# Patient Record
Sex: Male | Born: 1945 | Race: White | Hispanic: No | Smoking: Former smoker
Health system: Southern US, Community
[De-identification: ages and names within clinical notes are randomized; demographics above are authoritative.]

## PROBLEM LIST (undated history)

## (undated) DIAGNOSIS — J449 Chronic obstructive pulmonary disease, unspecified: Secondary | ICD-10-CM

## (undated) DIAGNOSIS — I509 Heart failure, unspecified: Secondary | ICD-10-CM

## (undated) DIAGNOSIS — N189 Chronic kidney disease, unspecified: Secondary | ICD-10-CM

## (undated) DIAGNOSIS — I10 Essential (primary) hypertension: Secondary | ICD-10-CM

## (undated) HISTORY — PX: CHOLECYSTECTOMY: SHX55

---

## 2019-12-13 ENCOUNTER — Emergency Department (HOSPITAL_COMMUNITY): Payer: Medicare Other

## 2019-12-13 ENCOUNTER — Other Ambulatory Visit: Payer: Self-pay

## 2019-12-13 ENCOUNTER — Observation Stay (HOSPITAL_COMMUNITY)
Admission: EM | Admit: 2019-12-13 | Discharge: 2019-12-14 | Disposition: A | Discharge status: Home / Self Care | Payer: Medicare Other | Attending: General Surgery | Admitting: General Surgery

## 2019-12-13 ENCOUNTER — Encounter (HOSPITAL_COMMUNITY): Payer: Self-pay | Admitting: Emergency Medicine

## 2019-12-13 DIAGNOSIS — I4891 Unspecified atrial fibrillation: Secondary | ICD-10-CM | POA: Insufficient documentation

## 2019-12-13 DIAGNOSIS — Z87891 Personal history of nicotine dependence: Secondary | ICD-10-CM | POA: Diagnosis not present

## 2019-12-13 DIAGNOSIS — Z8673 Personal history of transient ischemic attack (TIA), and cerebral infarction without residual deficits: Secondary | ICD-10-CM | POA: Diagnosis not present

## 2019-12-13 DIAGNOSIS — S20219A Contusion of unspecified front wall of thorax, initial encounter: Secondary | ICD-10-CM | POA: Diagnosis not present

## 2019-12-13 DIAGNOSIS — Y92410 Unspecified street and highway as the place of occurrence of the external cause: Secondary | ICD-10-CM | POA: Diagnosis not present

## 2019-12-13 DIAGNOSIS — I2510 Atherosclerotic heart disease of native coronary artery without angina pectoris: Secondary | ICD-10-CM | POA: Insufficient documentation

## 2019-12-13 DIAGNOSIS — Y9389 Activity, other specified: Secondary | ICD-10-CM | POA: Insufficient documentation

## 2019-12-13 DIAGNOSIS — J984 Other disorders of lung: Secondary | ICD-10-CM | POA: Insufficient documentation

## 2019-12-13 DIAGNOSIS — N189 Chronic kidney disease, unspecified: Secondary | ICD-10-CM | POA: Insufficient documentation

## 2019-12-13 DIAGNOSIS — J90 Pleural effusion, not elsewhere classified: Secondary | ICD-10-CM | POA: Insufficient documentation

## 2019-12-13 DIAGNOSIS — Z7982 Long term (current) use of aspirin: Secondary | ICD-10-CM | POA: Insufficient documentation

## 2019-12-13 DIAGNOSIS — Z79899 Other long term (current) drug therapy: Secondary | ICD-10-CM | POA: Insufficient documentation

## 2019-12-13 DIAGNOSIS — I272 Pulmonary hypertension, unspecified: Secondary | ICD-10-CM | POA: Insufficient documentation

## 2019-12-13 DIAGNOSIS — I509 Heart failure, unspecified: Secondary | ICD-10-CM | POA: Insufficient documentation

## 2019-12-13 DIAGNOSIS — Z20822 Contact with and (suspected) exposure to covid-19: Secondary | ICD-10-CM | POA: Insufficient documentation

## 2019-12-13 DIAGNOSIS — Z888 Allergy status to other drugs, medicaments and biological substances status: Secondary | ICD-10-CM | POA: Diagnosis not present

## 2019-12-13 DIAGNOSIS — S27892A Contusion of other specified intrathoracic organs, initial encounter: Secondary | ICD-10-CM | POA: Diagnosis present

## 2019-12-13 DIAGNOSIS — I130 Hypertensive heart and chronic kidney disease with heart failure and stage 1 through stage 4 chronic kidney disease, or unspecified chronic kidney disease: Secondary | ICD-10-CM | POA: Insufficient documentation

## 2019-12-13 DIAGNOSIS — I700 Atherosclerosis of aorta: Secondary | ICD-10-CM | POA: Diagnosis not present

## 2019-12-13 DIAGNOSIS — N62 Hypertrophy of breast: Secondary | ICD-10-CM | POA: Diagnosis not present

## 2019-12-13 DIAGNOSIS — J439 Emphysema, unspecified: Secondary | ICD-10-CM | POA: Diagnosis not present

## 2019-12-13 DIAGNOSIS — M5430 Sciatica, unspecified side: Secondary | ICD-10-CM | POA: Diagnosis not present

## 2019-12-13 DIAGNOSIS — N281 Cyst of kidney, acquired: Secondary | ICD-10-CM | POA: Diagnosis not present

## 2019-12-13 DIAGNOSIS — Z9049 Acquired absence of other specified parts of digestive tract: Secondary | ICD-10-CM | POA: Diagnosis not present

## 2019-12-13 DIAGNOSIS — S2221XA Fracture of manubrium, initial encounter for closed fracture: Secondary | ICD-10-CM | POA: Diagnosis not present

## 2019-12-13 DIAGNOSIS — I428 Other cardiomyopathies: Secondary | ICD-10-CM | POA: Insufficient documentation

## 2019-12-13 HISTORY — DX: Heart failure, unspecified: I50.9

## 2019-12-13 HISTORY — DX: Essential (primary) hypertension: I10

## 2019-12-13 HISTORY — DX: Chronic kidney disease, unspecified: N18.9

## 2019-12-13 HISTORY — DX: Chronic obstructive pulmonary disease, unspecified: J44.9

## 2019-12-13 LAB — COMPREHENSIVE METABOLIC PANEL
ALT: 13 U/L (ref 0–44)
AST: 19 U/L (ref 15–41)
Albumin: 3.2 g/dL — ABNORMAL LOW (ref 3.5–5.0)
Alkaline Phosphatase: 60 U/L (ref 38–126)
Anion gap: 11 (ref 5–15)
BUN: 24 mg/dL — ABNORMAL HIGH (ref 8–23)
CO2: 24 mmol/L (ref 22–32)
Calcium: 8.3 mg/dL — ABNORMAL LOW (ref 8.9–10.3)
Chloride: 108 mmol/L (ref 98–111)
Creatinine, Ser: 1.61 mg/dL — ABNORMAL HIGH (ref 0.61–1.24)
GFR calc Af Amer: 48 mL/min — ABNORMAL LOW (ref >60)
GFR calc non Af Amer: 42 mL/min — ABNORMAL LOW (ref >60)
Glucose, Bld: 108 mg/dL — ABNORMAL HIGH (ref 70–99)
Potassium: 3.5 mmol/L (ref 3.5–5.1)
Sodium: 143 mmol/L (ref 135–145)
Total Bilirubin: 1.2 mg/dL (ref 0.3–1.2)
Total Protein: 6.2 g/dL — ABNORMAL LOW (ref 6.5–8.1)

## 2019-12-13 LAB — CBC WITH DIFFERENTIAL/PLATELET
Abs Immature Granulocytes: 0.05 10*3/uL (ref 0.00–0.07)
Basophils Absolute: 0.1 10*3/uL (ref 0.0–0.1)
Basophils Relative: 1 %
Eosinophils Absolute: 0.3 10*3/uL (ref 0.0–0.5)
Eosinophils Relative: 4 %
HCT: 36.7 % — ABNORMAL LOW (ref 39.0–52.0)
Hemoglobin: 11.6 g/dL — ABNORMAL LOW (ref 13.0–17.0)
Immature Granulocytes: 1 %
Lymphocytes Relative: 9 %
Lymphs Abs: 0.7 10*3/uL (ref 0.7–4.0)
MCH: 31.8 pg (ref 26.0–34.0)
MCHC: 31.6 g/dL (ref 30.0–36.0)
MCV: 100.5 fL — ABNORMAL HIGH (ref 80.0–100.0)
Monocytes Absolute: 0.5 10*3/uL (ref 0.1–1.0)
Monocytes Relative: 7 %
Neutro Abs: 5.4 10*3/uL (ref 1.7–7.7)
Neutrophils Relative %: 78 %
Platelets: 163 10*3/uL (ref 150–400)
RBC: 3.65 MIL/uL — ABNORMAL LOW (ref 4.22–5.81)
RDW: 13.7 % (ref 11.5–15.5)
WBC: 6.9 10*3/uL (ref 4.0–10.5)
nRBC: 0 % (ref 0.0–0.2)

## 2019-12-13 LAB — TROPONIN I (HIGH SENSITIVITY): Troponin I (High Sensitivity): 12 ng/L (ref <18)

## 2019-12-13 LAB — SARS CORONAVIRUS 2 (TAT 6-24 HRS): SARS Coronavirus 2: NEGATIVE

## 2019-12-13 LAB — I-STAT CREATININE, ED: Creatinine, Ser: 1.5 mg/dL — ABNORMAL HIGH (ref 0.61–1.24)

## 2019-12-13 MED ORDER — MORPHINE SULFATE (PF) 2 MG/ML IV SOLN
2.0000 mg | Freq: Once | INTRAVENOUS | Status: AC
  Administered 2019-12-13: 2 mg via INTRAVENOUS
  Filled 2019-12-13: qty 1

## 2019-12-13 MED ORDER — DOCUSATE SODIUM 100 MG PO CAPS
100.0000 mg | ORAL_CAPSULE | Freq: Two times a day (BID) | ORAL | Status: DC
  Administered 2019-12-13 – 2019-12-14 (×3): 100 mg via ORAL
  Filled 2019-12-13 (×4): qty 1

## 2019-12-13 MED ORDER — ATORVASTATIN CALCIUM 80 MG PO TABS
80.0000 mg | ORAL_TABLET | Freq: Every day | ORAL | Status: DC
  Administered 2019-12-13 – 2019-12-14 (×2): 80 mg via ORAL
  Filled 2019-12-13 (×2): qty 1

## 2019-12-13 MED ORDER — IOHEXOL 300 MG/ML  SOLN
100.0000 mL | Freq: Once | INTRAMUSCULAR | Status: AC | PRN
  Administered 2019-12-13: 100 mL via INTRAVENOUS

## 2019-12-13 MED ORDER — ALLOPURINOL 100 MG PO TABS
100.0000 mg | ORAL_TABLET | Freq: Every day | ORAL | Status: DC
  Administered 2019-12-13 – 2019-12-14 (×2): 100 mg via ORAL
  Filled 2019-12-13 (×2): qty 1

## 2019-12-13 MED ORDER — TAMSULOSIN HCL 0.4 MG PO CAPS
0.4000 mg | ORAL_CAPSULE | Freq: Two times a day (BID) | ORAL | Status: DC
  Administered 2019-12-13 – 2019-12-14 (×3): 0.4 mg via ORAL
  Filled 2019-12-13 (×3): qty 1

## 2019-12-13 MED ORDER — METOPROLOL TARTRATE 5 MG/5ML IV SOLN
5.0000 mg | Freq: Once | INTRAVENOUS | Status: AC
  Administered 2019-12-13: 5 mg via INTRAVENOUS
  Filled 2019-12-13: qty 5

## 2019-12-13 MED ORDER — IPRATROPIUM-ALBUTEROL 0.5-2.5 (3) MG/3ML IN SOLN: 3.0000 mL | Freq: Four times a day (QID) | RESPIRATORY_TRACT | Status: DC | PRN

## 2019-12-13 MED ORDER — ENOXAPARIN SODIUM 30 MG/0.3ML ~~LOC~~ SOLN
30.0000 mg | Freq: Two times a day (BID) | SUBCUTANEOUS | Status: DC
  Administered 2019-12-13 – 2019-12-14 (×2): 30 mg via SUBCUTANEOUS
  Filled 2019-12-13 (×2): qty 0.3

## 2019-12-13 MED ORDER — BUPROPION HCL ER (SR) 150 MG PO TB12
150.0000 mg | ORAL_TABLET | Freq: Two times a day (BID) | ORAL | Status: DC
  Administered 2019-12-13 – 2019-12-14 (×3): 150 mg via ORAL
  Filled 2019-12-13 (×3): qty 1

## 2019-12-13 MED ORDER — METOPROLOL SUCCINATE ER 100 MG PO TB24
100.0000 mg | ORAL_TABLET | Freq: Two times a day (BID) | ORAL | Status: DC
  Administered 2019-12-13 – 2019-12-14 (×3): 100 mg via ORAL
  Filled 2019-12-13: qty 1
  Filled 2019-12-13: qty 4
  Filled 2019-12-13 (×2): qty 1

## 2019-12-13 MED ORDER — NITROGLYCERIN 0.4 MG SL SUBL: 0.4000 mg | SUBLINGUAL_TABLET | SUBLINGUAL | Status: DC | PRN

## 2019-12-13 MED ORDER — ONDANSETRON 4 MG PO TBDP
4.0000 mg | ORAL_TABLET | Freq: Once | ORAL | Status: AC
  Administered 2019-12-13: 4 mg via ORAL
  Filled 2019-12-13: qty 1

## 2019-12-13 MED ORDER — ACETAMINOPHEN 500 MG PO TABS
1000.0000 mg | ORAL_TABLET | Freq: Once | ORAL | Status: AC
  Administered 2019-12-13: 08:00:00 1000 mg via ORAL
  Filled 2019-12-13: qty 2

## 2019-12-13 MED ORDER — OXYCODONE HCL 5 MG PO TABS
5.0000 mg | ORAL_TABLET | ORAL | Status: DC | PRN
  Administered 2019-12-13: 5 mg via ORAL
  Filled 2019-12-13: qty 1

## 2019-12-13 MED ORDER — METHOCARBAMOL 500 MG PO TABS
1000.0000 mg | ORAL_TABLET | Freq: Three times a day (TID) | ORAL | Status: DC
  Administered 2019-12-13 – 2019-12-14 (×3): 1000 mg via ORAL
  Filled 2019-12-13 (×4): qty 2

## 2019-12-13 MED ORDER — UMECLIDINIUM BROMIDE 62.5 MCG/INH IN AEPB
1.0000 | INHALATION_SPRAY | Freq: Every day | RESPIRATORY_TRACT | Status: DC
  Filled 2019-12-13: qty 7

## 2019-12-13 MED ORDER — ACETAMINOPHEN 500 MG PO TABS
1000.0000 mg | ORAL_TABLET | Freq: Four times a day (QID) | ORAL | Status: DC
  Administered 2019-12-13 – 2019-12-14 (×4): 1000 mg via ORAL
  Filled 2019-12-13 (×5): qty 2

## 2019-12-13 MED ORDER — TORSEMIDE 20 MG PO TABS
100.0000 mg | ORAL_TABLET | Freq: Every day | ORAL | Status: DC
  Administered 2019-12-13 – 2019-12-14 (×2): 100 mg via ORAL
  Filled 2019-12-13 (×2): qty 5

## 2019-12-13 MED ORDER — METOPROLOL TARTRATE 5 MG/5ML IV SOLN
5.0000 mg | Freq: Four times a day (QID) | INTRAVENOUS | Status: DC | PRN
  Administered 2019-12-13: 5 mg via INTRAVENOUS
  Filled 2019-12-13: qty 5

## 2019-12-13 MED ORDER — ONDANSETRON 4 MG PO TBDP: 4.0000 mg | ORAL_TABLET | Freq: Four times a day (QID) | ORAL | Status: DC | PRN

## 2019-12-13 MED ORDER — POTASSIUM CHLORIDE CRYS ER 10 MEQ PO TBCR
10.0000 meq | EXTENDED_RELEASE_TABLET | Freq: Two times a day (BID) | ORAL | Status: DC
  Administered 2019-12-13 – 2019-12-14 (×3): 10 meq via ORAL
  Filled 2019-12-13 (×3): qty 1

## 2019-12-13 MED ORDER — PANTOPRAZOLE SODIUM 40 MG PO TBEC
40.0000 mg | DELAYED_RELEASE_TABLET | Freq: Two times a day (BID) | ORAL | Status: DC
  Administered 2019-12-13 – 2019-12-14 (×3): 40 mg via ORAL
  Filled 2019-12-13 (×3): qty 1

## 2019-12-13 MED ORDER — ONDANSETRON HCL 4 MG/2ML IJ SOLN: 4.0000 mg | Freq: Four times a day (QID) | INTRAMUSCULAR | Status: DC | PRN
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
## 2019-12-13 NOTE — ED Notes (Signed)
Lunch Tray Ordered @ 1240. 

## 2019-12-13 NOTE — Progress Notes (Signed)
Patient arrived to unit in NAD, VS stable and patient free from pain.  
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
## 2019-12-13 NOTE — ED Notes (Signed)
Lunch Tray Ordered @ 1240.
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
## 2019-12-14 LAB — CBC
HCT: 36.5 % — ABNORMAL LOW (ref 39.0–52.0)
Hemoglobin: 11.4 g/dL — ABNORMAL LOW (ref 13.0–17.0)
MCH: 31.9 pg (ref 26.0–34.0)
MCHC: 31.2 g/dL (ref 30.0–36.0)
MCV: 102.2 fL — ABNORMAL HIGH (ref 80.0–100.0)
Platelets: 158 10*3/uL (ref 150–400)
RBC: 3.57 MIL/uL — ABNORMAL LOW (ref 4.22–5.81)
RDW: 14 % (ref 11.5–15.5)
WBC: 6.5 10*3/uL (ref 4.0–10.5)
nRBC: 0 % (ref 0.0–0.2)

## 2019-12-14 MED ORDER — OXYCODONE HCL 5 MG PO TABS: 5.0000 mg | ORAL_TABLET | Freq: Four times a day (QID) | ORAL | 0 refills | Status: AC | PRN

## 2019-12-14 MED ORDER — ACETAMINOPHEN 500 MG PO TABS: 1000.0000 mg | ORAL_TABLET | Freq: Four times a day (QID) | ORAL | 0 refills | Status: AC

## 2019-12-14 NOTE — Progress Notes (Signed)
Pt was up to bathroom

## 2019-12-14 NOTE — Discharge Instructions (Signed)
Sternal Fracture  A sternal fracture is a break in the bone in the center of the chest (sternum or breastbone). This type of fracture often causes pain that can get worse when you breathe deeply or cough. A sternal fracture is not dangerous unless there is also an injury to your heart or lungs, which are protected by the sternum and ribs. What are the causes? This condition is usually caused by a forceful injury from:  Motor vehicle accidents. This is the most common cause.  Contact sports.  Physical assaults.  Falls. You can also develop a sternal fracture without having a forceful injury if the bone becomes weakened over time (stress fracture or insufficiency fracture). What increases the risk? You are more likely to have a sternal fracture if you:  Participate in contact sports, such as football, lacrosse, wrestling, or martial arts.  Work at elevated heights, such as in construction. This increases your risk of a fall. The following factors may make you more likely to develop a stress fracture or insufficiency fracture:  Being male.  Being a postmenopausal woman.  Being 50 years of age or older.  Having weak bones (osteoporosis).  Having severe curvature of the spine.  Being on long-term steroid treatment. What are the signs or symptoms? Symptoms of this condition include:  Pain over the sternum or chest wall.  Tenderness of the sternum or chest wall.  Pain that gets worse when you breathe deeply or cough.  Shortness of breath.  A bruise (contusion) over the chest.  Swelling.  A crackling sound when taking a deep breath or pressing on the sternum. How is this diagnosed? This condition is diagnosed based on:  A physical exam.  Your medical history.  Tests, such as: ? Blood oxygen level. This is measured with a pulse oximetry test. ? Repeated electrocardiograms (ECGs). This is to make sure that your heart is not injured. ? A blood test. This is to check  for damage to your heart muscle. ? Imaging tests, such as:  A CT scan.  An ultrasound.  Chest X-rays. How is this treated? Treatment depends on the severity of your injury.  A sternal fracture without any other injury (isolated sternal fracture) usually heals without treatment. You may need to: ? Limit some activities at home. ? Take medicines for pain relief. ? Do deep breathing exercises to prevent injury and infection to your lungs.  In rare cases, surgery may be needed if a sternal fracture: ? Continues to cause severe pain. ? Causes shortness of breath or respiratory problems. ? Involves bones that have been moved too far out of position (displaced fracture). Follow these instructions at home: Managing pain, stiffness, and swelling   If directed, put ice on the injured area. ? Put ice in a plastic bag. ? Place a towel between your skin and the bag. ? Leave the ice on for 20 minutes, 2-3 times a day. Medicines  Take over-the-counter and prescription medicines only as told by your health care provider.  Ask your health care provider if the medicine prescribed to you: ? Requires you to avoid driving or using heavy machinery. ? Can cause constipation. You may need to take actions to prevent or treat constipation, such as:  Drink enough fluid to keep your urine pale yellow.  Take over-the-counter or prescription medicines.  Eat foods that are high in fiber, such as beans, whole grains, and fresh fruits and vegetables.  Limit foods that are high in fat and processed   sugars, such as fried or sweet foods. Activity  Rest at home.  Return to your normal activities as told by your health care provider. Ask your health care provider what activities are safe for you.  Do breathing exercises as told by your health care provider.  Do not push or pull with your arms when getting in and out of bed.  Do not lift anything that is heavier than 10 lb (4.5 kg), or the limit that  you are told, until your health care provider says that it is safe. General instructions  Hug a pillow when you sneeze, cough, or twist or bend at the waist. Doing this helps support your chest.  Do not use any products that contain nicotine or tobacco, such as cigarettes, e-cigarettes, and chewing tobacco. These can delay bone healing. If you need help quitting, ask your health care provider.  Keep all follow-up visits as told by your health care provider. This is important. Contact a health care provider if:  Your pain medicine is not helping.  You continue to have pain after several weeks.  You have swelling or bruising that gets worse.  You develop a fever or chills.  You develop a cough and you cough up thick or bloody mucus from your lungs (sputum). Get help right away if you:  Have difficulty breathing.  Have chest pain.  Feel light-headed.  Have fast or irregular heartbeats (palpitations).  Feel nauseous or have pain in your abdomen. Summary  A sternal fracture is a break in the bone in the center of the chest (sternum or breastbone).  This condition is usually caused by a forceful injury. The most common cause is motor vehicle accidents.  If directed, put ice on the injured area.  Return to your normal activities as told by your health care provider. Ask your health care provider what activities are safe for you. This information is not intended to replace advice given to you by your health care provider. Make sure you discuss any questions you have with your health care provider. Document Revised: 09/02/2018 Document Reviewed: 09/02/2018 Elsevier Patient Education  2020 [employer] Inc.  
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
## 2019-12-14 NOTE — Progress Notes (Signed)
RN gave pt discharge instructions and pt stated understanding, IV has been removed and belongings have been packed. 1 new medication escribed to pt home pharmacy.
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
## 2024-07-24 NOTE — H&P (Signed)
 ASSESSMENT/PLAN  Active Hospital Problems   *Acute exacerbation of chronic heart failure (*)   Persistent atrial fibrillation s/p Watchman implant 06/24/2018   Chronic combined systolic and diastolic congestive heart failure (*)   Nonischemic cardiomyopathy (*)   Nonobstructive atherosclerosis of coronary artery   Chronic atrial fibrillation (*)   Hypertension Blood pressure 190s/90s.Troponin are elevated but flat. BNP 7612 IV Lasix. Monitor I/Os, daily weights, renal function & electrolytes. Supplemental oxygen prn keep sats >90%. Obtain 2-D echo. HF navigator consult. GDMT optimization.   Telemetry His spironolactone, lisinopril were held during last hospitalization.  His kidney function seems to be stable.  Will resume lisinopril, and continue closely monitor. Continue atorvastatin , Jardiance, metoprolol  Patient seems to be on high-dose aspirin which were held since last admission as well.    COPD    Chronic hypoxic respiratory failure, on home oxygen therapy  On home 3 L of oxygen Trelegy Ellipta to be substituted to Symbicort and Incruse Ellipta   Macrocytic anemia 8.0.  Will continue to monitor.  Patient's hemoglobin remains about the same since last admission in outside facility.  MDD (recurrent major depressive disorder) in remission (HCC)  Continue sertraline, Wellbutrin   CKD stage 3b Continue to monitor kidney function    CODE STATUS: FULL code     VTE prophylaxis: Lovenox  HISTORY  CC: Shortness of breath on exertion HPI: Patrick Stephens is a 78 y.o. male with history of CKD, HFpEF, CAD, Afib s/p watchman in 2019, Cirrhosis, COPD on 3L [place] baseline, GERD, HTN.  His wife was at bedside as well.  States that he came to the ER for shortness of breath especially with activity.  He also noticed his legs are more swollen and he has gained 4 pounds since the last admission at outside hospital.  He took his torsemide  yesterday.  He usually takes torsemide  when he  gained more than 4 pounds in a week.  He has been using oxygen on 3 L for a year.  Otherwise denies chest pain, palpitation, nausea, vomiting, abdominal pain. He was recently admitted to outside hospital for postprocedure monitoring. He was found to have a small enhancing right lower pole renal mass and underwent successful cryoablation with IR on 10/17. During the procedure he developed a small right posterior pararenal hematoma without continued expansion post procedure. He remained hemodynamically stable throughout the procedure. He had a drop in hemoglobin next day morning and found to have large hematoma without any active extravasation. Patient received blood transfusion and hemoglobin came up appropriately   ED Findings and Interventions per ED providers note: Chest x-ray with acute patchy bilateral infiltrates.  Remaining lab studies obtained BNP 7600 chest X-Ray noted for ground glass basilar opacities consistent with probable CHF versus infectious.  Blood cultures were obtained he was initiated on Lasix 40 mg IV and IV antibiotics.     CT chest abdomen pelvis obtained     IMPRESSION: 1.  Bilateral groundglass infiltrate, could represent edema or infection. 2.  Small partially loculated right pleural effusion. 3.  Chronic left pleural effusion and/or chronic pleural thickening. 4.  Heterogeneous ill-defined mass involving the inferior right kidney concerning for neoplasm. Recommend multiphase CT for further evaluation. 5.  Bilateral renal cysts. Complex left renal cyst. 6.  Complex collection within the right retroperitoneal space, appearance most suggestive of a possible recent hematoma. No active hemorrhage present. 7.  Heterogeneous liver, particularly within the inferior right hepatic lobe. This can be better evaluated on the above recommended multiphase CT. 8.  Ascites   Tthe patient will be admitted to general medicine service.  Patient was informed and agreeable to the plan. Past  Medical History:  Diagnosis Date  . Anemia of chronic disease 05/31/2023  . Anemia of chronic renal failure, stage 3b (*) 05/31/2023  . Atrial fibrillation (*)   . CHF (congestive heart failure) (*)   . CKD (chronic kidney disease) stage 3, GFR 30-59 ml/min (*) 07/16/2018  . COPD (chronic obstructive pulmonary disease) (*)   . Gallstones   . GERD (gastroesophageal reflux disease)   . History of thoracentesis 11/03/2022   LAST PROCEDURE 11/01/22 PUNCTURED LUNG, SHORTNESS OF BREATH AND HOSPTIALIZED FOR 12 DAYS PER PT.  SABRA Hypertension   . Persistent atrial fibrillation s/p Watchman implant 06/24/2018 06/24/2018  . Stroke (*) 08/30/2018   SOME RESIDUAL WEAKNESS BUT PT HAS IMPROVED WITH PHYSICAL THERAPY  . Vitamin B12 deficiency 05/31/2023   Past Surgical History:  Procedure Laterality Date  . Cholecystectomy  10/27/2018   CHOLECYSTECTOMY LAPAROSCOPIC  . Colonoscopy  01/09/2021   [facility] polyps recall 2029  . Endoscopy  2023  . Esophagogastroduodenoscopy  01/02/2023  . Knee cartilage surgery Left   . Right open inguinal hernia repair (right)  07/01/2023   RIGHT OPEN INGUINAL HERNIA REPAIR (Right) / [facility], [facility]  . Thoracentesis  11/01/2022   X 4, [facility].  SABRA Umbilical hernia repair  10/27/2018   CHOLANGIOGRAM REPAIR UMBILICAL HERNIA  . Upper gastrointestinal endoscopy  01/02/2023  . Watchman  06/24/2018    Prior to Admission medications  Medication Sig Start Date End Date Taking? Authorizing Provider  allopurinol  (ZYLOPRIM ) 100 mg tablet Take one tablet (100 mg dose) by mouth every morning. 08/18/18   Historical Provider, MD  aspirin 325 mg tablet Take one tablet (325 mg dose) by mouth every morning. 06/26/20   Megan P Hulen, NP  atorvastatin  (LIPITOR ) 80 mg tablet Take one tablet (80 mg dose) by mouth every morning. 10/22/17   Historical Provider, MD  buPROPion  hcl (WELLBUTRIN  XL) 300 mg 24 hr tablet Take one tablet (300 mg dose) by mouth every morning.    Historical Provider,  MD  ciprofloxacin (CILOXAN) 0.3% ophthalmic solution  01/04/24   Historical Provider, MD  Ergocalciferol (VITAMIN D2 PO) Take 50,000 Units by mouth once a week.    Historical Provider, MD  ferrous sulfate 325 (65 FE) MG tablet Take one tablet (325 mg dose) by mouth 3 (three) times a week. TAKE MONDAY/WED/FRIDAY IN THE AM.    Historical Provider, MD  finasteride (PROSCAR) 5 mg tablet Take one tablet (5 mg dose) by mouth at bedtime. 03/12/23   Historical Provider, MD  fluticasone-umeclidin-vilant (TRELEGY ELLIPTA) 100-62.5-25 MCG/ACT AEPB inhaler Inhale one puff into the lungs every morning. 03/17/23   Historical Provider, MD  JARDIANCE 10 mg TABS tablet TAKE 1 TABLET BY MOUTH DAILY 07/06/24   Redell DELENA Montenegro, MD  ketorolac tromethamine (ACULAR) 0.5% ophthalmic solution SMARTSIG:In Eye(s) Patient not taking: Reported on 06/16/2024 01/04/24   Historical Provider, MD  lisinopril (PRINIVIL,ZESTRIL) 5 mg tablet Take one tablet (5 mg dose) by mouth daily. 06/02/23   Redell DELENA Montenegro, MD  magnesium oxide (MAGNESIUM) 400 TABS Take one tablet (400 mg dose) by mouth at bedtime.    Historical Provider, MD  metolazone (ZAROXOLYN) 5 mg tablet TAKE 1 TABLET BY MOUTH DAILY Patient not taking: Reported on 06/16/2024 04/22/24   Redell DELENA Montenegro, MD  metoprolol  succinate (TOPROL  XL) 25 mg 24 hr tablet Take one tablet (25 mg dose) by  mouth daily. 06/26/23   Redell DELENA Montenegro, MD  nitroGLYCERIN  (NITROSTAT ) 0.4 mg SL tablet Place one tablet (0.4 mg dose) under the tongue every 5 (five) minutes as needed. 11/15/16   Historical Provider, MD  OXYGEN Inhale 2 L into the lungs continuous. Patient taking differently: Inhale 3 L into the lungs as needed.    Historical Provider, MD  pantoprazole  sodium (PROTONIX ) 40 mg tablet Take one tablet (40 mg dose) by mouth 2 (two) times daily. 09/17/23   Historical Provider, MD  prednisoLONE acetate (PRED FORTE,ECONOPRED) 1% ophthalmic suspension  01/04/24   Historical Provider, MD  sertraline (ZOLOFT) 50  mg tablet Take one tablet (50 mg dose) by mouth every morning. Patient taking differently: Take three tablets (150 mg dose) by mouth every morning. 10/22/17   Historical Provider, MD  spironolactone (ALDACTONE) 25 mg tablet Take one tablet (25 mg dose) by mouth 2 (two) times daily. 09/17/23   Historical Provider, MD  tamsulosin  (FLOMAX ) 0.4 mg CAPS Take one capsule (0.4 mg dose) by mouth 2 (two) times daily. 10/22/17   Historical Provider, MD  torsemide  (DEMADEX ) 20 mg tablet Take one tablet (20 mg dose) by mouth 2 (two) times daily. Patient taking differently: Take one tablet (20 mg dose) by mouth daily. 06/18/20   Megan P Hulen, NP     Allergies: I reviewed patient allergy list.  Allergies[1]   Short Social History[2] Full Code  Health Care POA/Medical Decision Maker:  Family History  Problem Relation Age of Onset  . Cancer Mother 48       brain  . Heart disease Maternal Grandmother   . Diabetes Maternal Grandmother   . Hypertension Neg Hx   . Stroke Neg Hx   . Kidney disease Neg Hx        ROS: Positive per HPI otherwise all other systems performed and negative     EXAM  Temp:  [97.6 F (36.4 C)-98.7 F (37.1 C)] 98.6 F (37 C) Heart Rate:  [92-106] 106 Resp:  [17-28] 18 BP: (166-207)/(80-97) 166/80 SpO2:  [97 %-100 %] 98 %  O2 Device: Nasal cannula O2 Flow Rate (L/min): 3 L/min  Physical Exam:  GENERAL:  Well developed well nourished in no acute respiratory distress HEENT:  Normocephalic atraumatic. Pupils relatively equal and reactive to light. Vision and hearing grossly normal. Sclerae nonicteric. Nares are clear. Oropharynx is moist without lesions or exudate NECK: No meningismus. No thyromegaly. Supple LUNGS: Clear to auscultation without accessory muscle use CARDIOVASCULAR: Regular rate and rhythm. No murmurs   No chest wall tenderness to palpation. ABDOMEN:  Positive bowel sounds. Soft. Nontender nondistended.  A faded bruise on the right flank area can be  seen. EXTREMITIES: No clubbing cyanosis or edema. 2+ pulses NEUROLOGIC:  Alert and oriented 3. Strength and sensation appear to be grossly symmetric SKIN:  No acute rashes or lesions. Good skin turgor.   RESULTS   Recent Results (from the past 24 hours)  ECG 12 lead   Collection Time: 07/24/24 11:29 AM  Result Value Ref Range   Acquisition Device D3K    Ventricular Rate 96 BPM   Atrial Rate 78 BPM   QRS Duration 96 ms   Q-T Interval 378 ms   QTC Calculation(Bazett) 477 ms   Calculated R Axis 106 degrees   Calculated T Axis 168 degrees   ECG Diagnosis      Atrial fibrillation with premature ventricular or aberrantly conducted complexes Rightward axis Possible Anterior infarct , age undetermined Abnormal ECG When  compared with ECG of 06-May-2023 15:52, Nonspecific T wave abnormality, worse in Inferior leads   CBC And Differential   Collection Time: 07/24/24 11:31 AM  Result Value Ref Range   WBC 4.4 4.0 - 10.5 thou/mcL   RBC 2.64 (L) 4.63 - 6.08 million/mcL   HGB 8.0 (L) 13.7 - 17.5 gm/dL   HCT 71.9 (L) 59.8 - 48.9 %   MCV 106.1 (H) 79.0 - 92.2 fL   MCH 30.3 25.7 - 32.2 pg   MCHC 28.6 (L) 32.3 - 36.5 gm/dL   Plt Ct 876 (L) 849 - 400 thou/mcL   RDW SD 63.5 (H) 35.1 - 46.3 fL   MPV 10.4 9.4 - 12.4 fL   NRBC% 0.0 0.0 - 0.2 /100WBC   Absolute NRBC Count 0.00 0.00 - 0.01 thou/mcL   NEUTROPHIL % 77.2 %   LYMPHOCYTE % 6.8 %   MONOCYTE % 9.3 %   Eosinophil % 5.5 %   BASOPHIL % 0.7 %   IG% 0.5 %   ABSOLUTE NEUTROPHIL COUNT 3.40 1.50 - 7.50 thou/mcL   ABSOLUTE LYMPHOCYTE COUNT 0.30 (L) 1.00 - 4.50 thou/mcL   Absolute Monocyte Count 0.41 0.10 - 0.80 thou/mcL   Absolute Eosinophil Count 0.24 0.00 - 0.50 thou/mcL   Absolute Basophil Count 0.03 0.00 - 0.20 thou/mcL   Absolute Immature Granulocyte Count 0.02 0.00 - 0.03 thou/mcL   Comprehensive Metabolic Panel   Collection Time: 07/24/24 11:31 AM  Result Value Ref Range   Na 145 136 - 146 mmol/L   Potassium 5.3 3.7 - 5.4  mmol/L   Cl 110 (H) 97 - 108 mmol/L   CO2 27 20 - 32 mmol/L   AGAP 8 7 - 16 mmol/L   Glucose 101 (H) 65 - 99 mg/dL   BUN 41 (H) 8 - 27 mg/dL   Creatinine 8.43 (H) 9.23 - 1.27 mg/dL   Ca 8.8 8.6 - 89.7 mg/dL   ALK PHOS 860 25 - 839 U/L   T Bili 1.2 0.0 - 1.2 mg/dL   Total Protein 7.0 6.0 - 8.5 gm/dL   Alb 3.6 3.5 - 4.8 gm/dL   GLOBULIN 3.4 1.5 - 4.5 gm/dL   ALBUMIN/GLOBULIN RATIO 1.1 1.1 - 2.5   BUN/CREAT RATIO 26.3 (H) 11.0 - 26.0   ALT 12 0 - 55 U/L   AST 19 0 - 40 U/L   eGFR 45 (L) >=60 mL/min/1.12m2  Protime-INR   Collection Time: 07/24/24 11:31 AM  Result Value Ref Range   PT 15.5 (H) 11.8 - 14.3 second(s)   INR 1.2 See Therapeutic ranges  PTT   Collection Time: 07/24/24 11:31 AM  Result Value Ref Range   PTT 35 22 - 35 second(s)  Magnesium   Collection Time: 07/24/24 11:31 AM  Result Value Ref Range   Mg 2.1 1.6 - 2.6 mg/dL  NT-proBNP   Collection Time: 07/24/24 11:31 AM  Result Value Ref Range   NT-ProBNP 7,612 (H) <=1,799 pg/mL  Ammonia   Collection Time: 07/24/24 12:00 PM  Result Value Ref Range   Ammonia 47 16 - 60 mcmol/L  Gen5 Cardiac Troponin T (TnT5) Baseline Series at: baseline, 1 hour, and 3 hour; Onset of symptoms: Greater than or equal 3 hours   Collection Time: 07/24/24 12:00 PM  Result Value Ref Range   TnT-Gen5 (0hr) 43 (H) <22 ng/L  POCT iSTAT Venous Panel - Yes reflex 2 hour   Collection Time: 07/24/24  1:09 PM  Result Value Ref Range   POC LACTATE  VEN 1.16 0.50 - 2.00 mmol/L   SAMPLE SOURCE VENOUS    INSTRUMENT ID [id number]    OPERATOR ID [id number]   ECG 12 lead   Collection Time: 07/24/24  1:09 PM  Result Value Ref Range   Acquisition Device D3K    Systolic BP 172 mmHg   Diastolic BP 72 mmHg   Ventricular Rate 99 BPM   Atrial Rate 100 BPM   QRS Duration 86 ms   Q-T Interval 382 ms   QTC Calculation(Bazett) 490 ms   Calculated R Axis 105 degrees   Calculated T Axis 53 degrees   ECG Diagnosis      Atrial fibrillation Rightward  axis Possible Anterior infarct (cited on or before 24-Jul-2024) Abnormal ECG When compared with ECG of 24-Jul-2024 11:29, Nonspecific T wave abnormality no longer evident in Inferior leads   ECG 12 lead   Collection Time: 07/24/24  1:21 PM  Result Value Ref Range   Acquisition Device D3K    Systolic BP 183 mmHg   Diastolic BP 112 mmHg   Ventricular Rate 97 BPM   Atrial Rate 150 BPM   QRS Duration 88 ms   Q-T Interval 384 ms   QTC Calculation(Bazett) 487 ms   Calculated R Axis 102 degrees   Calculated T Axis 255 degrees   ECG Diagnosis      Atrial fibrillation Rightward axis Possible Anterior infarct (cited on or before 24-Jul-2024) ST & T wave abnormality, consider inferior ischemia Abnormal ECG When compared with ECG of 24-Jul-2024 13:09, T wave inversion now evident in Inferior leads   Gen5 Cardiac Troponin T (TnT5) 1H   Collection Time: 07/24/24  1:23 PM  Result Value Ref Range   TnT-Gen5 (1hr) 41 (H) <22 ng/L   Delta 1 Hour -2 <5 ng/L  Urinalysis w/ Reflex Microscopic; Reflex to Culture - Symptomatic   Collection Time: 07/24/24  1:25 PM  Result Value Ref Range   Urine Color Yellow Yellow    Urine Clarity Clear Clear   Urine Specific Gravity 1.014 1.005 - 1.030   Urine pH 5.5 5 to 9   Urine Protein - Dipstick 20 (A) Negative mg/dl   Urine Glucose Negative Negative mg/dL   Urine Ketones Negative Negative mg/dl   Urine Bilirubin Negative Negative mg/dL   Urine Blood >8.9 (A) Negative mg/dL   Urine Nitrite Negative Negative   Urine Urobilinogen <2 <2 mg/dl   Urine Leukocyte Esterase Negative Negative Leu/mcL   Urine Squamous Epithelial Cells 0-2 0 - 2 /HPF   Urine WBC 0-2 0 - 2 /hpf   Urine RBC 5-10 (A) 0 - 2 /HPF   Urine Bacteria Trace None /HPF   UA Microscopic Yes Micro (A) No Micro  ECG 12 lead   Collection Time: 07/24/24  1:53 PM  Result Value Ref Range   Acquisition Device D3K    Ventricular Rate 112 BPM   Atrial Rate 197 BPM   QRS Duration 90 ms    Q-T Interval 360 ms   QTC Calculation(Bazett) 491 ms   Calculated R Axis 99 degrees   Calculated T Axis 102 degrees   ECG Diagnosis      Atrial fibrillation with rapid ventricular response Rightward axis Possible Anterior infarct (cited on or before 24-Jul-2024) Abnormal ECG When compared with ECG of 24-Jul-2024 13:21, T wave inversion no longer evident in Inferior leads   Gen5 Cardiac Troponin T (TnT5) 3H   Collection Time: 07/24/24  2:53 PM  Result Value Ref Range  TnT-Gen5 (3hr) 39 (H) <22 ng/L   Delta 3 Hour -4 <7 ng/L  ECG 12 lead   Collection Time: 07/24/24  3:28 PM  Result Value Ref Range   Acquisition Device D3K    Systolic BP 205 mmHg   Diastolic BP 91 mmHg   Ventricular Rate 89 BPM   Atrial Rate 122 BPM   QRS Duration 86 ms   Q-T Interval 394 ms   QTC Calculation(Bazett) 479 ms   Calculated R Axis 96 degrees   Calculated T Axis 57 degrees   ECG Diagnosis      Atrial fibrillation Rightward axis Possible Anterior infarct (cited on or before 24-Jul-2024) Abnormal ECG When compared with ECG of 24-Jul-2024 13:53, Nonspecific T wave abnormality now evident in Inferior leads   ECG 12 lead   Collection Time: 07/24/24  4:10 PM  Result Value Ref Range   Acquisition Device D3K    Systolic BP 207 mmHg   Diastolic BP 93 mmHg   Ventricular Rate 87 BPM   Atrial Rate 81 BPM   QRS Duration 84 ms   Q-T Interval 398 ms   QTC Calculation(Bazett) 478 ms   Calculated R Axis 94 degrees   Calculated T Axis 80 degrees   ECG Diagnosis      Atrial fibrillation Rightward axis Possible Anterior infarct (cited on or before 24-Jul-2024) Abnormal ECG When compared with ECG of 24-Jul-2024 15:28, Nonspecific T wave abnormality no longer evident in Inferior leads     Imaging:  XR Chest Ap Portable Result Date: 07/24/2024 SINGLE VIEW AP CHEST INDICATION:Shortness of breath COMPARISON: 12/23/2023. FINDINGS: Diffuse patchy bilateral airspace disease. Stable pleural thickening in the  lung bases. Right apical pleural thickening. No pneumothorax. Cardiomegaly. The mediastinal contour is otherwise unremarkable.   IMPRESSION: Acute patchy bilateral infiltrate Electronically Signed by: Sonny Appl, MD on 07/24/2024 12:16 PM  CT Chest Abdomen Pelvis W IV Contrast Result Date: 07/24/2024 INDICATION: sepsis?. COMPARISON:  None.    TECHNIQUE:  CT CHEST ABDOMEN PELVIS W IV CONTRAST - 80 mL  80 mL Isovue 370 were administered intravenously. Dose reduction was utilized (automated exposure control, mA or kV adjustment based on patient size, or iterative image reconstruction). FINDINGS: SUPPORT APPARATUS: N.A. LUNGS/PLEURA: Pulmonary emphysema. Small partially loculated right pleural effusion. Chronic effusion versus pleural thickening within the left lung base. No pneumothorax. Mild diffuse groundglass within the upper lobes and lingula. HEART/MEDIASTINUM: Cardiomegaly with dilatation of the right heart. Coronary Calcifications: Present. No acute thoracic aortic abnormalities. Atherosclerotic calcification. No hilar, mediastinal, or axillary lymphadenopathy. SOLID ABDOMINAL VISCERA: Liver: Heterogeneous liver. Ill-defined area of hypoenhancement within the posterior right hepatic lobe, may be perfusional.. Prominence of the CBD, no distal obstructing stone or mass Gallbladder: Cholecystectomy. Pancreas: Diffuse atrophy of the pancreas. Adrenal glands: Unremarkable. Spleen: Enlarged spleen measuring 15 cm in length Kidneys: No stones or hydronephrosis. Multiple bilateral renal cysts. Complex left renal cyst with internal septation or debris measuring 1.7 cm. Ill-defined heterogeneous masslike density involving the inferior right kidney measuring 4.2 cm (image 222) concerning for neoplasm. GI: No bowel obstruction. Diffuse gastric wall thickening. No focal bowel wall thickening or inflammatory changes. Diverticulosis. Normal appendix. PERITONEAL CAVITY/RETROPERITONEUM: Mild pelvic and bilateral flank  ascites No pneumoperitoneum. No lymphadenopathy. Atherosclerotic calcifications of the aorta. Normal caliber IVC. Upper abdominal varices. Within the right retroperitoneal fat, there is a complex collection measuring 5.6 x 9.2 cm with appearance suggesting evolution of the prior hematoma. No active hemorrhage identified. PELVIS: Unremarkable bladder. Prostate calcifications. MUSCULOSKELETAL: Degenerative change in the  hips and spine. No acute bone findings. Mild body wall edema MISC./CHRONIC FINDINGS: N/A or as above.   IMPRESSION: 1.  Bilateral groundglass infiltrate, could represent edema or infection. 2.  Small partially loculated right pleural effusion. 3.  Chronic left pleural effusion and/or chronic pleural thickening. 4.  Heterogeneous ill-defined mass involving the inferior right kidney concerning for neoplasm. Recommend multiphase CT for further evaluation. 5.  Bilateral renal cysts. Complex left renal cyst. 6.  Complex collection within the right retroperitoneal space, appearance most suggestive of a possible recent hematoma. No active hemorrhage present. 7.  Heterogeneous liver, particularly within the inferior right hepatic lobe. This can be better evaluated on the above recommended multiphase CT. 8.  Ascites ####CODE SIGNIFICANT REPORT#### multiple findings as above, please read Electronically Signed by: Sonny Appl, MD on 07/24/2024 2:28 PM          [1] Allergies Allergen Reactions  . Sotalol Anaphylaxis  [2] Social History Tobacco Use  . Smoking status: Former    Current packs/day: 1.50    Average packs/day: 1.5 packs/day for 60.0 years (90.0 ttl pk-yrs)    Types: Cigarettes    Passive exposure: Past  . Smokeless tobacco: Never  . Tobacco comments:    104 pack year history/ none 04/17/23  Vaping Use  . Vaping status: Never Used  Substance Use Topics  . Alcohol use: Yes    Comment: 1 bev QOweek  . Drug use: Never

## 2024-08-02 NOTE — Progress Notes (Signed)
 CARDIOLOGY PROGRESS NOTE   ASSESSMENT: Principal Problem:   Acute exacerbation of chronic heart failure (*) Active Problems:   COPD with exacerbation (*)   Chronic atrial fibrillation (*)   Nonischemic cardiomyopathy (*)   Nonobstructive atherosclerosis of coronary artery   Chronic combined systolic and diastolic congestive heart failure (*)   Persistent atrial fibrillation s/p Watchman implant 06/24/2018   Renal mass, right   Retroperitoneal hematoma  Persistent atrial fibrillation s/p Watchman implant 06/24/2018 -heart rate is under control on metoprolol  ER.   Patient is on aspirin 80 mg p.o. daily. Patient denies palpitations, dizziness, near-syncope or syncope. Patient follows with Dr. Isabell.   Acute on Chronic combined systolic and diastolic congestive heart failure -proBNP was 7612.   Patient has chronic dyspnea and fatigue.  Chest x-ray from July 24, 2024 revealed acute patchy bilateral infiltrate. Follow-up chest x-ray from November 17 - pulmonary venous congestion without frank edema. Unchanged trace effusions with adjacent atelectasis.  Patient feels better. No shortness of breath at rest on 4 L/min nasal cannula oxygen. DOE has improved. Patient still feels weak. Bilateral lower extremity edema has improved significantly and only trace bilateral ankle edema noted today. proBNP has improved and is down to 7195.  History of nonischemic cardiomyopathy with improved LV systolic function- previous cardiac echo was 45 to 50% in the past.   Cardiac echo on this admission normal LV systolic function with LVEF 55 to 60%, with mild to moderate concentric LVH, strain pattern suggest cardiac amyloidosis, severely dilated LA cannot exclude bicuspid aortic valve, mild AI, no aortic stenosis, mild to moderate TR with severe pulmonary hypertension.  Moderate MR with centrally directed jet, RA pressure 15 mmHg.  Patient has good urine output with -5.05 L in the last 2  days. Patient lost 5 pounds since November 9. Patient is on bumetanide drip, spironolactone 25 mg p.o. twice daily, lisinopril 5 mg p.o. daily, metoprolol  ER 25 mg p.o. daily.  History of nonobstructive atherosclerosis of coronary artery -patient denies any chest pain. Previous nuclear stress test from August 2021-Small-to-moderate size moderate intensity basal inferior, mild intensity mid inferior and distal inferior fixed defect equivocal for small scar versus secondary to diaphragmatic attenuation.  -No evidence of ischemia. Prognostically this is a low risk scan.  - Left ventricular ejection fraction of 60%. Mildly dilated LV. LV end-diastolic volume -869 ml, end-systolic volume -52 ml.  - Normal left ventricular systolic wall motion.   Hypertension -BP is under control.   Hypokalemia with K 3.3 few days ago-improved and is 3.8.   COPD with exacerbation -improved.  Noted.  Renal mass, right, recent cryoablation on July 01, 2024 at [facility]-noted. Retroperitoneal hematoma after procedure-noted. CT chest abdomen pelvis performed November 9- 1.  Bilateral groundglass infiltrate, could represent edema or infection. 2.  Small partially loculated right pleural effusion. 3.  Chronic left pleural effusion and/or chronic pleural thickening. 4.  Heterogeneous ill-defined mass involving the inferior right kidney concerning for neoplasm. Recommend multiphase CT for further evaluation. 5.  Bilateral renal cysts. Complex left renal cyst. 6.  Complex collection within the right retroperitoneal space, appearance most suggestive of a possible recent hematoma. No active hemorrhage present. 7.  Heterogeneous liver, particularly within the inferior right hepatic lobe. This can be better evaluated on the above recommended multiphase CT. 8.  Ascites.   AKI on CKD-creatinine 1.68, 1.47, 1.65 and 1.75 with GFR 41, 49, 42 and 39-noted.  Creatinine today is up to 2.19 with GFR down to 30.  Severe  Anemia secondary to blood loss, s/p recent transfusions -Hgb 7.3, 8.1, 7.7 and 8.6, MCV 99.7.  Hgb today stable 8.6 with MCV 101.  Further management as per primary team.  Moderate thrombocytopenia -platelet counts noted 120, 111, 95 and 99.  Likely secondary to cirrhosis.  Further management as per primary team.  Cirrhosis -noted.  Further management as per primary team.     PLAN:  1.  Considering worsening kidney function, I will discontinue metolazone and change bumetanide drip to 1 mg IV twice a day. 2.  BMP tomorrow. 3.  I would avoid hypotension. 4. I would keep potassium between 4.0-5.0 range and magnesium 2.0-2.5 range. 5. Follow fluids in and out strict. 6. Daily weights. 7.  If kidney function continues worsening I will hold lisinopril and consider holding spironolactone. 8.  Considering worsening kidney function I would avoid switching from lisinopril to Entresto.   SUBJECTIVE:  Patient feels overall better. Patient still feels weak. No dyspnea on 4 L/min nasal cannula oxygen. Patient denies chest pain, shortness of breath, palpitations, near syncope or syncope, orthopnoe or PND.  Blood pressure is under control. The heart rate is under control. Oxygen saturation remains in normal range most of the time but at 6:15 PM yesterday afternoon O2 sat was down to 78%.  This morning O2 sat 99%. Fluid balance  is -1.95 L    in the last 24 hours and 4.7 L in the last 2 days and 7 L in the last 3 days. Patient lost 15 pounds since November 9.    OBJECTIVE: Vitals:   08/02/24 0819  BP: 127/60  Pulse: 88  Resp:   Temp:   SpO2:     Intake/Output Summary (Last 24 hours) at 08/02/2024 0823 Last data filed at 08/02/2024 9340 Gross per 24 hour  Intake 1199.49 ml  Output 3100 ml  Net -1900.51 ml   General: Comfortable, no distress, well-appearing Neck: No JVD, normal carotid upstrokes with no bruits CV: Irregularly irregular, S1S2, 1 out of 6 systolic murmur at the left  sternal border, no rub, no gallop Lungs: Mildly decreased breath sounds to auscultation bilaterally, no wheezing, no rales Abd: Soft, nontender, normal BS Ext: No peripheral edema bilaterally Neuro: non-focal   Labs: Recent Results (from the past 24 hours)  NT-proBNP   Collection Time: 08/01/24 11:01 AM  Result Value Ref Range   NT-ProBNP 7,185 (H) <=1,799 pg/mL  Magnesium   Collection Time: 08/02/24  1:54 AM  Result Value Ref Range   Mg 1.7 1.6 - 2.6 mg/dL  Basic Metabolic Panel   Collection Time: 08/02/24  1:54 AM  Result Value Ref Range   Na 142 136 - 146 mmol/L   Potassium 3.9 3.7 - 5.4 mmol/L   Cl 94 (L) 97 - 108 mmol/L   CO2 39 (H) 20 - 32 mmol/L   AGAP 9 7 - 16 mmol/L   Glucose 99 65 - 99 mg/dL   BUN 51 (H) 8 - 27 mg/dL   Creatinine 7.80 (H) 9.23 - 1.27 mg/dL   Ca 8.9 8.6 - 89.7 mg/dL   BUN/CREAT RATIO 76.6 11.0 - 26.0   eGFR 30 (L) >=60 mL/min/1.73m2  CBC And Differential   Collection Time: 08/02/24  1:54 AM  Result Value Ref Range   WBC 4.4 4.0 - 10.5 thou/mcL   RBC 2.85 (L) 4.63 - 6.08 million/mcL   HGB 8.6 (L) 13.7 - 17.5 gm/dL   HCT 71.1 (L) 59.8 - 48.9 %   MCV 101.1 (H)  79.0 - 92.2 fL   MCH 30.2 25.7 - 32.2 pg   MCHC 29.9 (L) 32.3 - 36.5 gm/dL   Plt Ct 891 (L) 849 - 400 thou/mcL   RDW SD 67.9 (H) 35.1 - 46.3 fL   MPV 11.6 9.4 - 12.4 fL   NRBC% 0.0 0.0 - 0.2 /100WBC   Absolute NRBC Count 0.00 0.00 - 0.01 thou/mcL   NEUTROPHIL % 72.1 %   LYMPHOCYTE % 9.6 %   MONOCYTE % 10.3 %   Eosinophil % 6.6 %   BASOPHIL % 0.9 %   IG% 0.5 %   ABSOLUTE NEUTROPHIL COUNT 3.16 1.50 - 7.50 thou/mcL   ABSOLUTE LYMPHOCYTE COUNT 0.42 (L) 1.00 - 4.50 thou/mcL   Absolute Monocyte Count 0.45 0.10 - 0.80 thou/mcL   Absolute Eosinophil Count 0.29 0.00 - 0.50 thou/mcL   Absolute Basophil Count 0.04 0.00 - 0.20 thou/mcL   Absolute Immature Granulocyte Count 0.02 0.00 - 0.03 thou/mcL      ECHO: Echocardiogram Complete WO Enhancing Agent Result Date: 07/25/2024 .  Left  Ventricle: There is mild to moderate concentric hypertrophy. Systolic function is normal. EF: 55-60%. Quantitative analysis of left ventricular Global Longitudinal Strain (GLS) imaging is -11.200%, which is abnormal. Strain imaging pattern suggests cardiac amyloidosis. Ejection fraction measured by 3D is 50%, which is normal. Wall motion abnormalities as outlined in graphic representation. .  Left Atrium: Left atrium is severely dilated.  at 6.200 cm. Left atrium volume index is severely increased (>48 mL/m2). .  Right Atrium: Volume is severely increased. .  Aortic Valve: The leaflets are mildly thickened and exhibit probably normal excursion.  I cannot rule out a bicuspid valve. Mild aortic valve regurgitation. .  Mitral Valve: There is moderate regurgitation with a centrally directed jet. .  IVC/SVC: The inferior vena cava demonstrates a diameter of >2.1 cm and collapses <50%; therefore, the right atrial pressure is estimated at 15 mmHg. .  Tricuspid Valve: There is mild to moderate regurgitation. The right ventricular systolic pressure is severely elevated (>60 mmHg).   CXR: XR Chest Ap Portable Result Date: 08/01/2024 TECHNIQUE: AP chest. COMPARISON: 07/24/2024. INDICATION: CHF. FINDINGS: There is pulmonary venous congestion that has improved compared to prior. Trace effusions persist. There are minimal basilar opacities. There is no new infiltrate. Atrial device unchanged. There are degenerative changes in the spine.   IMPRESSION: Pulmonary venous congestion without frank edema. Unchanged trace effusions with adjacent atelectasis. Electronically Signed by: Reyes Luna, MD on 08/01/2024 12:17 PM   Note: This documented was generated using voice recognition software. There may be unintended transcription errors that were not detected upon document review.

## 2024-08-06 NOTE — Discharge Summary (Signed)
 [facility] [address] [facility] Discharge Summary  PCP: Norleen KANDICE Cheshire, MD Discharge Details   Admit date:         07/24/2024 Discharge date:        08/06/2024  Hospital LOS:    12 days DIscharge Disposition: To [facility]  Active Hospital Problems   Diagnosis Date Noted POA  . *Acute exacerbation of chronic heart failure (*) 07/24/2024 Yes  . Renal mass, right 07/25/2024 Unknown  . Retroperitoneal hematoma 07/25/2024 Unknown  . Persistent atrial fibrillation s/p Watchman implant 06/24/2018 06/24/2018 Yes  . Chronic combined systolic and diastolic congestive heart failure (*) 05/21/2018 Yes  . Nonischemic cardiomyopathy (*) 12/24/2017 Yes  . Nonobstructive atherosclerosis of coronary artery 12/24/2017 Yes  . Chronic atrial fibrillation (*) 12/22/2017 Yes  . COPD with exacerbation (*) 09/11/2011 Yes    Resolved Hospital Problems  No resolved problems to display.   Hospital Course  Physicians involved in care during this hospitalization Attending Provider: Deward Gilmore Constant, MD Attending Provider: Deedee Flatten, MD Attending Provider: Arlin Minion, MD Attending Provider: Cathlean FORBES Lanius, MD Attending Provider: Arlin Minion, MD Attending Provider: Deedee Flatten, MD Admitting Provider: Deedee Flatten, MD Consulting Physician: Harlene Bookman, RN BSN Consulting Physician: Cathlean FORBES Lanius, MD Consulting Physician: Sheena FORBES Burows, MD Consulting Physician: Arlin Minion, MD Consulting Physician: Molinda KANDICE Packer, MD Consulting Physician: Augusto ONEIDA Kendall, MD Consulting Physician: [facility] [place]) Consulting Physician: Alm Dames, MD Consulting Physician: Lonni Bowers, MD  Indication for Admission: Acute exacerbation of chronic heart failure   History of Present Illness as per H&P:Patrick Stephens is a 78 y.o. male with history of CKD, HFpEF, CAD, Afib s/p watchman in 2019, Cirrhosis, COPD on 3L [place]  baseline, GERD, HTN.  His wife was at bedside as well.  States that he came to the ER for shortness of breath especially with activity.  He also noticed his legs are more swollen and he has gained 4 pounds since the last admission at outside hospital.  He took his torsemide  yesterday.  He usually takes torsemide  when he gained more than 4 pounds in a week.  He has been using oxygen on 3 L for a year.  Otherwise denies chest pain, palpitation, nausea, vomiting, abdominal pain. He was recently admitted to outside hospital for postprocedure monitoring. He was found to have a small enhancing right lower pole renal mass and underwent successful cryoablation with IR on 10/17. During the procedure he developed a small right posterior pararenal hematoma without continued expansion post procedure. He remained hemodynamically stable throughout the procedure. He had a drop in hemoglobin next day morning and found to have large hematoma without any active extravasation. Patient received blood transfusion and hemoglobin came up appropriately   Hospital Course:       Persistent atrial fibrillation s/p Watchman implant 06/24/2018 Acute on chronic chronic combined systolic and diastolic congestive heart failure (*) Nonobstructive atherosclerosis of coronary artery Hypertension Nonischemic cardiomyopathy ? Concern for cardiac amyloidosis  Cardiology is on board.  Patient requires IV diuretics.  For the past 2 days his creatinine goes up and diuretics, Jardiance, lisinopril on hold for AKI.  Metoprolol  was decreased to avoid hypotension.  Gentle hydration was given by cardiology.  Urine output has been good.  Will need to continue cardiology service after patient being transferred.  AKI on CKD stage 3b Right renal mass with recent cryoablation Right retroperitoneal hematoma Worsening kidney function.  Urine studies are pending.  Nephrotoxins  and diuretics are on hold. Patient has had his repeat CT earlier this week  shows resolving size of the hematoma. Repeat renal ultrasound yesterday shows chronic renal disease, a complicated cyst versus mass at the right kidney which could be better characterized with multiphase postcontrast imaging.  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  Given patient's low eGFR, he will need to have IV hydration which will require cautiously management given patient's heart failure.  If patient indeed has ongoing hemorrhagic renal cysts that made him a transfusion dependent, patient will require IR for embolization down the road. I have discussed the above findings with nephrologist at [facility] who will be following with patient after patient being transferred to [facility].  Hospitalist at [facility] has accepted the transfer.  Discussed with patient for above who agrees with the transfer.  There is bladder wall thickening which can be seen with cystitis from the ultrasound however patient has no urinary symptoms..   Macrocytic anemia Thrombocytopenia History of chronic anemia secondary to chronic disease, iron deficiency, B12 deficiency, kidney disease He follows up with hema/onco as outpatient and is getting EPO treatment. On aspirin 325 at home now changed to aspirin 81 mg once daily by cardiology.  Patient threshold 8 given patient's pre-existing CAD. His last PCP visit on 07/18/2024 and a GI referral has been placed at that time.  Inpatient GI consult was obtained who recommended hematology evaluation and outpatient follow-up with GI provider. Negative direct Coombs, reticulocyte count. peripheral smear comes back as  - Moderate macrocytic, normochromic anemia. - Mild thrombocytopenia. - Negative for increased schistocytes, significant dysplasia, or blasts. Patient has been  transfusion dependent and he requires the four units of PRBC transfusion since admission including 1 units given this morning with hemoglobin 7.7. I have discussed with hematology oncology first on-call provider at [facility] earlier this week. Retacrit x 1 dose was given on 08/05/2024. Considering likelihood of patient has been having hemorrhagic cysts which causes ongoing anemia on top of chronic diseases.  I have updated hema on-call service this morning that patient is being transferred but I do not think necessarily we will need a hematology consult for now.  Will wait for the rest of the evaluation to be completed as mentioned above.  MDD (recurrent major depressive disorder) in remission (HCC)  Continue sertraline, Wellbutrin    COPD Remain on home oxygen (3L)   Bedside Procedures   No orders found      Unresulted Labs     Order Current Status   Prepare RBC, 1 Units In process   Type and Screen In process   Urea Nitrogen, Urine SO In process        Recommendations to physicians:   Ongoing management of worsening kidney function, anemia, heart failure         Code Status:   Full Code   Time spent in discharge process:  55 minutes  This note was dictated with voice recognition software. Similar sounding words can inadvertently be transcribed and may not be corrected upon review  Electronically signed: Deedee Flatten, MD 08/06/2024 / 8:45 AM

## 2024-08-06 NOTE — Consults (Addendum)
 [facility] Nephrology Consultation  Attending Physician: Donna MARLA Cruel, MD Consulting Physician: Mabel Shuck, MD Date of service: 08/06/2024 Reason For Consult: Acute kidney injury   Assessment / Recommendations and Plans   # Acute kidney injury: Sonogram without obstruction.  I think his AKI is most likely due to overdiuresis.  He was noted to be 18 pounds down his last hospitalization with alkalosis and hypotension.  He has absolutely no edema and is on his baseline home oxygen.  I have stressed to him the interplay between his numerous chronic advanced organ failure comorbidities (CHF, COPD, cirrhosis, CKD) and that it is difficult to have an optimal balance between these.  Keep all antihypertensives and diuretics on hold at this point.  I am going to gently hydrate with a very slow rate of normal saline over the next 24 hours.  He does not have any acute indications for dialysis.  Check urine sodium.  # Complicated right kidney cyst: This seems to be a nonurgent issue at present and can be dealt with in the outpatient setting.  Certainly would want to see improvement in his AKI before delivering an iodinated contrast load.  # Acute on chronic diastolic CHF:  # COPD # chronic hypoxic respiratory failure: Baseline 3 L nasal cannula oxygen - EF 55 to 60% with severely dilated bilateral atria and severely elevated RVSP. - Obtain chest x-ray  # Anemia of chronic disease: Follows with [facility] hematology for outpatient ESA.  # Goals of care: Presently full code.  Of note palliative care was following at [facility] and that would be reasonable.  He has numerous chronic debilitating illnesses.  History   Patrick Stephens is a 78 y.o. male with PMH as below (including stage IIIb CKD followed in clinic by [facility] nephrology, HFpEF, CAD, A-fib status post watchman, liver cirrhosis, COPD with chronic hypoxic respiratory failure on 3 L nasal cannula baseline,  hypertension) who is seen as a new consult by nephrology on 08/06/2024 at [facility] for acute kidney injury.  On my review of the record he appears to have fairly longstanding CKD following an AKI episode in 2019 with a peak creatinine of 3.6.  Following that his baseline creatinine was about 1.4-1.6 through early 2025, 1.7-2.0 as of late summer 2025 and remained at that level at the time of hospitalization at [facility] in early October 2025. 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 During the procedure he developed a small right posterior pararenal hematoma without continued expansion post procedure and was admitted.  He had hypotension and the creatinine trended up trending up to 4.4 by late October, then back down to 1.6 in early November with supportive care and holding diuretics temporarily (though volume seem to be a struggle) when rehospitalized to [facility] 07/24/2024 for shortness of breath and weight gain. Then creatinine climbed over the next 2 weeks back up to 3.5 at the time of transfer to [facility] and nephrology consultation on 08/06/2024.  He was hypotensive throughout a large portion of this time with systolics from the 80s to low 100s.  Continued to receive spironolactone, lisinopril, SGLT2 inhibitor and aggressive diuresis with 3 to 4 L of urine output for several days (18 pounds down) until AKI worsened as well as metabolic alkalosis.  One seen upon arrival and transfer this evening he is quite comfortable and has no complaints.  Past Medical History:  Diagnosis  Date  . Anemia of chronic disease 05/31/2023  . Anemia of chronic renal failure, stage 3b (*) 05/31/2023  . Atrial fibrillation (*)   . CHF (congestive heart failure) (*)   . CKD (chronic kidney disease) stage 3, GFR 30-59 ml/min (*) 07/16/2018  . COPD (chronic obstructive pulmonary disease) (*)   . Gallstones   . GERD (gastroesophageal  reflux disease)   . History of thoracentesis 11/03/2022   LAST PROCEDURE 11/01/22 PUNCTURED LUNG, SHORTNESS OF BREATH AND HOSPTIALIZED FOR 12 DAYS PER PT.  SABRA Hypertension   . Persistent atrial fibrillation s/p Watchman implant 06/24/2018 06/24/2018  . Stroke (*) 08/30/2018   SOME RESIDUAL WEAKNESS BUT PT HAS IMPROVED WITH PHYSICAL THERAPY  . Vitamin B12 deficiency 05/31/2023   Past Surgical History:  Procedure Laterality Date  . Cholecystectomy  10/27/2018   CHOLECYSTECTOMY LAPAROSCOPIC  . Colonoscopy  01/09/2021   [facility] polyps recall 2029  . Endoscopy  2023  . Esophagogastroduodenoscopy  01/02/2023  . Knee cartilage surgery Left   . Right open inguinal hernia repair (right)  07/01/2023   RIGHT OPEN INGUINAL HERNIA REPAIR (Right) / [facility], [facility]  . Thoracentesis  11/01/2022   X 4, [facility].  SABRA Umbilical hernia repair  10/27/2018   CHOLANGIOGRAM REPAIR UMBILICAL HERNIA  . Upper gastrointestinal endoscopy  01/02/2023  . Watchman  06/24/2018    Allergies[1] Medications Prior to admission: Medications Ordered Prior to Encounter[2] Social History[3] Family History  Problem Relation Age of Onset  . Cancer Mother 48       brain  . Heart disease Maternal Grandmother   . Diabetes Maternal Grandmother   . Hypertension Neg Hx   . Stroke Neg Hx   . Kidney disease Neg Hx     Review of Systems: Review of Systems  Constitutional:  Negative for fever and weight loss.  HENT:  Negative for nosebleeds.   Eyes:  Negative for blurred vision and double vision.  Respiratory:  Negative for cough, hemoptysis, sputum production, shortness of breath and wheezing.   Cardiovascular:  Negative for chest pain, orthopnea and leg swelling.  Gastrointestinal:  Negative for abdominal pain, blood in stool, nausea and vomiting.  Genitourinary:  Negative for dysuria, flank pain, frequency, hematuria and urgency.  Musculoskeletal:  Negative for falls and joint pain.  Skin:  Negative for itching and  rash.  Neurological:  Negative for dizziness, tremors, loss of consciousness and headaches.  Endo/Heme/Allergies:  Negative for polydipsia. Does not bruise/bleed easily.     Inpatient Medications: Scheduled Meds: . [Held by Provider] allopurinol   100 mg Oral q AM  . [START ON 08/07/2024] aspirin  81 mg Oral Daily  . atorvastatin   20 mg Oral HS  . budesonide-formoterol  2 puff Inhalation Q12H RSP   And  . [START ON 08/07/2024] umeclidinium bromide   1 puff Inhalation Daily RSP  . [Held by Provider] bumetanide  1 mg IntraVENous Daily  . [START ON 08/07/2024] buPROPion  hcl  300 mg Oral q AM  . [START ON 08/07/2024] calcium  carbonate  1,250 mg Oral Daily  . [Held by Provider] empagliflozin  10 mg Oral Daily  . [START ON 08/07/2024] enoxaparin   30 mg Subcutaneous Q24H SCH  . finasteride  5 mg Oral HS  . isosorbide dinitrate  10 mg Oral Q12H  . [Held by Provider] lisinopril  5 mg Oral Daily  . [START ON 08/07/2024] metoprolol  succinate  12.5 mg Oral Daily  . NaCl  10 mL Intracatheter Q12H SCH  . pantoprazole  sodium  40  mg Oral BID  . [START ON 08/07/2024] sertraline  100 mg Oral Daily  . [Held by Provider] spironolactone  25 mg Oral BID  . tamsulosin   0.4 mg Oral BID   Continuous Infusions: . NaCl    . NaCl    . NaCl    . NaCl    . NaCl    . NaCl     PRN Meds:.acetaminophen  **OR** acetaminophen , albuterol  sulfate, guaiFENesin, hydrALAZINE HCl, HYDROmorphone, HYDROmorphone, melatonin, NaCl, NaCl, NaCl, NaCl, NaCl, NaCl, NaCl **AND** NaCl, nitroGLYCERIN , ondansetron  **OR** ondansetron , polyethylene glycol, sodium chloride   Physical Examination  Temp:  [98 F (36.7 C)-99.1 F (37.3 C)] 98.5 F (36.9 C) Heart Rate:  [61-74] 64 Resp:  [16-20] 20 BP: (83-106)/(37-58) 101/41 SpO2:  [92 %-100 %] 97 %   O2 Device: Nasal cannula O2 Flow Rate (L/min): 3 L/min   No intake/output data recorded.   Constitutional: Alert, no apparent distress, vitals reviewed by me chronically  ill-appearing Eyes: extra-ocular movements are intact, sclerae anicteric  ENT: Nares clear, oropharynx clear  Neck / Lymphatics: Supple, No cervical or supra-clavicular lymphadenopathy  Cardiovascular: Regular rhythm, normal rate, no murmurs, no rubs, no edema  Pulmonary: Breath sounds very distant,  normal respiratory effort  Gastrointestinal:  Abdomen soft, non-tender, non-distended.  Genitourinary:  bladder not palpably distended, rest deferred  Musculoskeletal: no overt deformities  Skin: no rash, no induration on extremities  Neurological: Oriented to PP&T , moves all extremities without overt focal motor deficit Psychiatric: Normal eye contact and language, affect is appropriate   Dialysis Access:   Results  Pertinent Renal Workup: Lab Results  Component Value Date   Urine Specific Gravity 1.014 07/24/2024   Urine pH 5.5 07/24/2024   Urine Color Yellow 07/24/2024   Urine Protein - Dipstick 20 (A) 07/24/2024   Urine Blood >1.0 (A) 07/24/2024   Urine Ketones Negative 07/24/2024   Urine Nitrite Negative 07/24/2024   Urine Leukocyte Esterase Negative 07/24/2024   Urine RBC 5-10 (A) 07/24/2024   Urine WBC 0-2 07/24/2024   Urine Bacteria Trace 07/24/2024    No results found for: PROTCREATUR   Labs:  Recent Results (from the past 24 hours)  NT-proBNP   Collection Time: 08/06/24  3:28 AM  Result Value Ref Range   NT-ProBNP 4,226 (H) <=1,799 pg/mL  Magnesium   Collection Time: 08/06/24  3:28 AM  Result Value Ref Range   Mg 2.3 1.6 - 2.6 mg/dL  Renal Function Panel   Collection Time: 08/06/24  3:28 AM  Result Value Ref Range   BUN 94 (H) 8 - 27 mg/dL   Na 864 (L) 863 - 853 mmol/L   Potassium 4.9 3.7 - 5.4 mmol/L   Cl 91 (L) 97 - 108 mmol/L   CO2 35 (H) 20 - 32 mmol/L   AGAP 9 7 - 16 mmol/L   Glucose 109 (H) 65 - 99 mg/dL   Creatinine 6.50 (H) 9.23 - 1.27 mg/dL   Alb 2.9 (L) 3.5 - 4.8 gm/dL   Ca 8.3 (L) 8.6 - 89.7 mg/dL   Phos 4.3 2.5 - 4.5 mg/dL   BUN/CREAT  RATIO 73.0 (H) 11.0 - 26.0   eGFR 17 (L) >=60 mL/min/1.15m2  CBC And Differential   Collection Time: 08/06/24  3:28 AM  Result Value Ref Range   WBC 4.2 4.0 - 10.5 thou/mcL   RBC 2.50 (L) 4.63 - 6.08 million/mcL   HGB 7.7 (L) 13.7 - 17.5 gm/dL   HCT 74.1 (L) 59.8 - 48.9 %  MCV 103.2 (H) 79.0 - 92.2 fL   MCH 30.8 25.7 - 32.2 pg   MCHC 29.8 (L) 32.3 - 36.5 gm/dL   Plt Ct 892 (L) 849 - 400 thou/mcL   RDW SD 68.0 (H) 35.1 - 46.3 fL   MPV 11.7 9.4 - 12.4 fL   NRBC% 0.0 0.0 - 0.2 /100WBC   Absolute NRBC Count 0.00 0.00 - 0.01 thou/mcL   NEUTROPHIL % 67.6 %   LYMPHOCYTE % 13.7 %   MONOCYTE % 10.1 %   Eosinophil % 7.7 %   BASOPHIL % 0.7 %   IG% 0.2 %   ABSOLUTE NEUTROPHIL COUNT 2.81 1.50 - 7.50 thou/mcL   ABSOLUTE LYMPHOCYTE COUNT 0.57 (L) 1.00 - 4.50 thou/mcL   Absolute Monocyte Count 0.42 0.10 - 0.80 thou/mcL   Absolute Eosinophil Count 0.32 0.00 - 0.50 thou/mcL   Absolute Basophil Count 0.03 0.00 - 0.20 thou/mcL   Absolute Immature Granulocyte Count 0.01 0.00 - 0.03 thou/mcL   Prepare RBC, 1 Units   Collection Time: 08/06/24  7:57 AM  Result Value Ref Range   Unit ABO  O    Unit Rh NEG    [facility] Product Code E0686    Unit # Barcode [id number]    Unit Status transfused    Product Barcode [id number]    Prodt ABORH 9500    Prodt Exp [id number]   Type and Screen   Collection Time: 08/06/24  8:20 AM  Result Value Ref Range   ABO Rh Type O NEG    Antibody Screen NEG    Specimen Expiration 08/09/2024 23:59     Imaging: US  Renal Result Date: 08/05/2024 RENAL ULTRASOUND: 08/05/2024 11:32 PM HISTORY: 78 years  old Male with Renal Failure Acute. COMPARISON: CT from 07/31/2024 TECHNIQUE:  Limited sonographic evaluation of the kidneys was performed. FINDINGS:  Stephens kidneys demonstrate mildly increased cortical echogenicity. The right kidney measures up to 11.8 cm in length.  The left kidney measures up to 11.2 cm in length. No hydronephrosis is noted in either kidney. There is an  complicated cyst versus exophytic mass at the interpolar region of the right kidney measuring 3.3 x 3.3 x 2.4 cm. There are multiple cysts seen at the left kidney measuring 3.5 x 3.4 x 2.9 cm. The calcification at the left kidney measuring up to 5 mm. No free intraperitoneal fluid is seen. There is nonspecific urinary bladder wall thickening. The prostate gland is prominent and indents upon the base of the bladder. The visualized portions of the IVC are unremarkable.   The aorta is mildly prominent which can be followed and 5 years.                                    IMPRESSION: There is no evidence of hydronephrosis. Stephens kidneys demonstrate increased cortical echogenicity, suggestive of chronic medical renal disease. There is a complicated cyst versus mass at the right kidney which could be better characterized with multiphase postcontrast imaging. There is bladder wall thickening which can be seen with cystitis. The abdominal aorta is mildly prominent which can be followed in 5 years. Electronically Signed by: Rock Croft, MD on 08/05/2024 11:34 PM     I have personally reviewed the labs and imaging listed above.   This note was dictated with voice recognition software. Similar sounding words can inadvertently be transcribed and may not be corrected upon review.   Electronically Signed: Mabel  Rolan, MD 08/06/2024 / 4:04 PM       [1] Allergies Allergen Reactions  . Sotalol Anaphylaxis  [2] No current facility-administered medications on file prior to encounter.   Current Outpatient Medications on File Prior to Encounter  Medication Sig Dispense Refill  . albuterol  sulfate (PROVENTIL ) 2.5 mg/3 mL nebulizer solution Inhale 3 mLs (2.5 mg dose) into the lungs every 6 (six) hours as needed.    . allopurinol  (ZYLOPRIM ) 100 mg tablet Take one tablet (100 mg dose) by mouth every morning.    SABRA aspirin 325 mg tablet Take one tablet (325 mg dose) by mouth every morning.    . atorvastatin   (LIPITOR ) 80 mg tablet Take one tablet (80 mg dose) by mouth every morning.    . buPROPion  hcl (WELLBUTRIN  XL) 300 mg 24 hr tablet Take one tablet (300 mg dose) by mouth every morning.    . Ergocalciferol (VITAMIN D2 PO) Take 50,000 Units by mouth once a week.    . finasteride (PROSCAR) 5 mg tablet Take one tablet (5 mg dose) by mouth at bedtime.    . fluticasone-umeclidin-vilant (TRELEGY ELLIPTA) 100-62.5-25 MCG/ACT AEPB inhaler Inhale one puff into the lungs every morning.    SABRA JARDIANCE 10 mg TABS tablet TAKE 1 TABLET BY MOUTH DAILY 30 tablet 5  . lisinopril (PRINIVIL,ZESTRIL) 5 mg tablet Take one tablet (5 mg dose) by mouth daily. 30 tablet 5  . magnesium oxide (MAGNESIUM) 400 TABS Take one tablet (400 mg dose) by mouth at bedtime.    . metolazone (ZAROXOLYN) 5 mg tablet TAKE 1 TABLET BY MOUTH DAILY 30 tablet 3  . metoprolol  succinate (TOPROL  XL) 25 mg 24 hr tablet Take one tablet (25 mg dose) by mouth daily. 30 tablet 6  . nitroGLYCERIN  (NITROSTAT ) 0.4 mg SL tablet Place one tablet (0.4 mg dose) under the tongue every 5 (five) minutes as needed.    . OXYGEN Inhale 2 L into the lungs continuous. (Patient taking differently: Inhale 3 L into the lungs as needed.)    . pantoprazole  sodium (PROTONIX ) 40 mg tablet Take one tablet (40 mg dose) by mouth 2 (two) times daily.    . sertraline (ZOLOFT) 100 mg tablet Take one tablet (100 mg dose) by mouth daily.    SABRA spironolactone (ALDACTONE) 25 mg tablet Take one tablet (25 mg dose) by mouth 2 (two) times daily.    . tamsulosin  (FLOMAX ) 0.4 mg CAPS Take one capsule (0.4 mg dose) by mouth 2 (two) times daily.    . torsemide  (DEMADEX ) 20 mg tablet Take one tablet (20 mg dose) by mouth daily.    [3] Social History Socioeconomic History  . Marital status: Married  Occupational History  . Occupation: retired  Tobacco Use  . Smoking status: Former    Current packs/day: 1.50    Average packs/day: 1.5 packs/day for 60.0 years (90.0 ttl pk-yrs)    Types:  Cigarettes    Passive exposure: Past  . Smokeless tobacco: Never  . Tobacco comments:    104 pack year history/ none 04/17/23  Vaping Use  . Vaping status: Never Used  Substance and Sexual Activity  . Alcohol use: Yes    Alcohol/week: 0.0 - 1.0 standard drinks of alcohol    Comment: 1 bev QOweek  . Drug use: Never  . Sexual activity: Yes    Partners: Female

## 2024-08-06 NOTE — Progress Notes (Signed)
 [facility] Case Management Discharge Note   Patient:   Patrick Stephens MR Number:  [id number] Patient Date of Birth: 1946-07-03 Age/Sex:  78 y.o./male   Discharge Plan   Case Management interviewed: Patient Disposition: Other (Comment) (Transferring to [facility])                                        DME Discharge Planning Discharge Equipment: [employer] DME Walker Status: Patient declined DME (Pt insurance will not pay for new RW/Rollator at this time.  He will be purchasing from a supplier)  Discussed readiness, willingness and ability to provide or support self-management activities when needed after discharge from the acute care setting with: Patient       Transportation   Does the patient need discharge transport arranged?: No   Who is arranging transport?: Patient Mode of transportation?: other (comment) (Ambulance)    Accepted Agency   Selected Continued Care - Admitted Since 07/24/2024   No services have been selected for the patient.      The LOC Algorithm was used for appropriate DC planning and pt will be transferring to [facility] for have complex medical needs addressed.   Electronically signed: Darice Goldmann, MSW 08/06/2024 9:02 AM

## 2024-08-07 NOTE — Progress Notes (Signed)
 [facility] HEALTH [facility]   Nephrology Consultation - Follow Up Note  Attending Physician: Lonni CHRISTELLA Blumenthal, MD Consulting Physician: Mabel Shuck, MD Date of service: 08/07/2024 Reason For Consult:  Acute kidney injury    Assessment / Recommendations and Plans    # Acute kidney injury: Sonogram without obstruction.  No proteinuria.  I think his AKI is most likely due to overdiuresis.  He was noted to be 18 pounds down his [place] hospitalization with alkalosis and hypotension.  He has absolutely no edema and is on his baseline home oxygen on presentation to [place].  I have stressed to him the interplay between his numerous chronic advanced organ failure comorbidities (CHF, COPD, cirrhosis, CKD) and that it is difficult to have an optimal balance between these.  - Keep all antihypertensives and diuretics on hold at this point.  He is making urine and his creatinine is down slightly to 3.2 today from 3.4 on presentation.  I am going to gently hydrate with a very slow rate of normal saline over the next 24 hours.  He does not have any acute indications for dialysis.     # Complicated right kidney cyst: This seems to be a nonurgent issue at present and can be dealt with in the outpatient setting.  Certainly would want to see improvement in his AKI before delivering an iodinated contrast load.   # Acute on chronic diastolic CHF:  # COPD # chronic hypoxic respiratory failure: Baseline 3 L nasal cannula oxygen - EF 55 to 60% with severely dilated bilateral atria and severely elevated RVSP. - Obtain chest x-ray   # Anemia of chronic disease: Follows with [facility] hematology for outpatient ESA.   # Goals of care: Presently full code.  Of note palliative care was following at [place] and that would be reasonable.  He has numerous chronic debilitating illnesses.   History    Patrick Stephens is a 78 y.o. male with PMH as below (including stage IIIb CKD followed in  clinic by [facility] nephrology, HFpEF, CAD, A-fib status post watchman, liver cirrhosis, COPD with chronic hypoxic respiratory failure on 3 L nasal cannula baseline, hypertension) who is seen as a new consult by nephrology on 08/06/2024 at [facility] for acute kidney injury.  On my review of the record he appears to have fairly longstanding CKD following an AKI episode in 2019 with a peak creatinine of 3.6.  Following that his baseline creatinine was about 1.4-1.6 through early 2025, 1.7-2.0 as of late summer 2025 and remained at that level at the time of hospitalization at [facility] in early October 2025. 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 During the procedure he developed a small right posterior pararenal hematoma without continued expansion post procedure and was admitted.  He had hypotension and the creatinine trended up trending up to 4.4 by late October, then back down to 1.6 in early November with supportive care and holding diuretics temporarily (though volume seem to be a struggle) when rehospitalized to [facility] 07/24/2024 for shortness of breath and weight gain. Then creatinine climbed over the next 2 weeks back up to 3.5 at the time of transfer to [facility] and nephrology consultation on 08/06/2024.  He was hypotensive throughout a large portion of this time with systolics from the 80s to low 100s.  Continued to receive spironolactone, lisinopril, SGLT2 inhibitor and aggressive diuresis with 3 to 4 L of  urine output for several days (18 pounds down) until AKI worsened as well as metabolic alkalosis.  Interval history / subjective: No acute events overnight.  Actually off of oxygen when I saw him this morning.  Inpatient Medications: Scheduled Meds: . [Held by Provider] allopurinol   100 mg Oral q AM  . aspirin  81 mg Oral Daily  . atorvastatin   20 mg Oral HS  . budesonide-formoterol  2 puff  Inhalation Q12H RSP   And  . umeclidinium bromide   1 puff Inhalation Daily RSP  . buPROPion  hcl  300 mg Oral q AM  . calcium  carbonate  1,250 mg Oral Daily  . finasteride  5 mg Oral HS  . heparin  5,000 Units Subcutaneous Q8H 02-26-21  . metoprolol  succinate  12.5 mg Oral Daily  . NaCl  10 mL Intracatheter Q12H SCH  . pantoprazole  sodium  40 mg Oral BID  . sertraline  100 mg Oral Daily  . tamsulosin   0.4 mg Oral BID   Continuous Infusions: . NaCl    . NaCl    . NaCl    . NaCl    . NaCl    . NaCl    . NaCl 50 mL/hr (08/06/24 2022)   PRN Meds:.acetaminophen  **OR** acetaminophen , albuterol  sulfate, guaiFENesin, hydrALAZINE HCl, HYDROmorphone, HYDROmorphone, melatonin, NaCl, NaCl, NaCl, NaCl, NaCl, NaCl, NaCl **AND** NaCl, nitroGLYCERIN , ondansetron  **OR** ondansetron , polyethylene glycol, sodium chloride   Physical Examination  Temp:  [98.2 F (36.8 C)-98.6 F (37 C)] 98.3 F (36.8 C) Heart Rate:  [64-78] 78 Resp:  [15-20] 16 BP: (97-128)/(40-63) 128/61 SpO2:  [88 %-100 %] 91 % Pain Score: 0-No pain O2 Device: None (Room air) O2 Flow Rate (L/min): 1 L/min   I/O last 3 completed shifts: In: 340.8 [P.O.:240; I.V.:100.8] Out: 600 [Urine:600]      Constitutional: Alert, no apparent distress, vitals reviewed by me chronically ill-appearing  Cardiovascular: Regular rhythm, normal rate, no murmurs, no rubs, no edema  Pulmonary: Breath sounds very distant,  normal respiratory effort  Gastrointestinal:  Abdomen soft, non-tender, non-distended.  Skin: no rash, no induration on extremities  Neurological: Oriented to PP&T , moves all extremities without overt focal motor deficit Dialysis Access:    Results    Labs:  Recent Results (from the past 24 hours)  Protein / Creatinine Ratio, Urine   Collection Time: 08/06/24  6:42 PM  Result Value Ref Range   U Prot 14 0 - 15 mg/dL   U Creat 870.4 84.9 - 328.0 mg/dL   U Pr/Cr Ratio 9.88    Est 24H U Protein 108 mg/day   Sodium, Urine, Random   Collection Time: 08/06/24  6:42 PM  Result Value Ref Range   Sodium, Ur 22 mmol/L  Creatinine, Urine, Random   Collection Time: 08/06/24  6:42 PM  Result Value Ref Range   U Creat 130.4 15.0 - 328.0 mg/dL  CBC And Differential   Collection Time: 08/07/24  5:39 AM  Result Value Ref Range   WBC 3.6 (L) 4.0 - 10.5 thou/mcL   RBC 2.83 (L) 4.63 - 6.08 million/mcL   HGB 8.4 (L) 13.7 - 17.5 gm/dL   HCT 72.0 (L) 59.8 - 48.9 %   MCV 98.6 (H) 79.0 - 92.2 fL   MCH 29.7 25.7 - 32.2 pg   MCHC 30.1 (L) 32.3 - 36.5 gm/dL   Plt Ct 899 (L) 849 - 400 thou/mcL   RDW SD 70.4 (H) 35.1 - 46.3 fL   MPV 11.6 9.4 - 12.4  fL   NRBC% 0.0 0.0 - 0.2 /100WBC   Absolute NRBC Count 0.00 0.00 - 0.01 thou/mcL   NEUTROPHIL % 64.7 %   LYMPHOCYTE % 14.0 %   MONOCYTE % 10.4 %   Eosinophil % 8.7 %   BASOPHIL % 1.4 %   IG% 0.8 %   ABSOLUTE NEUTROPHIL COUNT 2.30 1.50 - 7.50 thou/mcL   ABSOLUTE LYMPHOCYTE COUNT 0.50 (L) 1.00 - 4.50 thou/mcL   Absolute Monocyte Count 0.37 0.10 - 0.80 thou/mcL   Absolute Eosinophil Count 0.31 0.00 - 0.50 thou/mcL   Absolute Basophil Count 0.05 0.00 - 0.20 thou/mcL   Absolute Immature Granulocyte Count 0.03 0.00 - 0.03 thou/mcL   Renal Function Panel   Collection Time: 08/07/24  5:39 AM  Result Value Ref Range   BUN 102 (H) 8 - 27 mg/dL   Na 863 863 - 853 mmol/L   Potassium 4.1 3.7 - 5.4 mmol/L   Cl 93 (L) 97 - 108 mmol/L   CO2 33 (H) 20 - 32 mmol/L   AGAP 10 7 - 16 mmol/L   Glucose 92 65 - 99 mg/dL   Creatinine 6.79 (H) 9.23 - 1.27 mg/dL   Alb 3.1 (L) 3.5 - 4.8 gm/dL   Ca 8.2 (L) 8.6 - 89.7 mg/dL   Phos 4.0 2.5 - 4.5 mg/dL   BUN/CREAT RATIO 68.0 (H) 11.0 - 26.0   eGFR 19 (L) >=60 mL/min/1.24m2  Magnesium   Collection Time: 08/07/24  5:39 AM  Result Value Ref Range   Mg 2.4 1.6 - 2.6 mg/dL  Iron and Total Iron-Binding Capacity (TIBC)   Collection Time: 08/07/24  5:39 AM  Result Value Ref Range   Iron 99.8 40.0 - 155.0 mcg/dL   UIBC 84 (L) 849 -  624 mcg/dL   % Iron Sat 54 15 - 55 %   IBCT 184 (L) 250 - 450 mcg/dL  Ferritin   Collection Time: 08/07/24  5:39 AM  Result Value Ref Range   Ferritin 342.0 30.0 - 400.0 ng/mL    Imaging: No results found.    I have personally reviewed the labs and imaging listed above.   This note was dictated with voice recognition software. Similar sounding words can inadvertently be transcribed and may not be corrected upon review.   Electronically Signed: Mabel Shuck, MD 08/07/2024 / 12:42 PM

## 2024-08-09 NOTE — Progress Notes (Signed)
 NICS Progress Note [facility] General Medicine Progress Note  Date of Admission:  08/06/2024 Length of Stay:  3 Days  Chief Complaint:SOB  Brief Clinical Summary   78 year old male with hx of HFpEF, pAF with watchman in place, Right renal mass with recent cryoablation and subsquent right retroperitoneal hematoma at OSH, CKD 3B with baseline creatinine of 1.5 to 2, and suspected chronic hypoxic respiratory failure and COPD. He initally presented to [facility] on 11/09 with dyspnea dx with  HFpEF exacerbation. He developed AKI with diuresis-  creat peaking at 3.49. He was transferred to [facility] for Nephrology consultation.   Suspect overdiuresis. Diuretics on hold with down trending creat.    Echocardiogram at OSH hospital there was a question of cardiac amyloid. Heart failure team has recommended nuclear medicine scan- this was a negative PYP study. .   Subjective/Events Overnight  SPEP, immunofixation, and free light chains  sent yesterday - creat 2.7->2.39 Hgb 8.4-8.3-8.2   Patient seen this afternoon. PYP negative today.  He reports he feels his breathing is good and would feel comfortable discharging once kidney function is sorted.   Objective   Vital signs in last 24 hours: Intake/Output:  Temp:  [98.1 F (36.7 C)-99.6 F (37.6 C)] 99.6 F (37.6 C) Heart Rate:  [67-87] 70 Resp:  [15-18] 18 BP: (100-146)/(55-69) 130/59 SpO2:  [97 %-100 %] 100 % O2 Flow Rate (L/min): 1 L/min  Wt Readings from Last 1 Encounters:  08/09/24 85 kg (187 lb 6.4 oz)    Weight change: -0.862 kg (-1 lb 14.4 oz)  Intake/Output Summary (Last 24 hours) at 08/09/2024 1823 Last data filed at 08/09/2024 1659 Gross per 24 hour  Intake 540 ml  Output 750 ml  Net -210 ml       Physical Exam: Constitutional - resting comfortably, no acute distress Eyes - pupils equal round and reactive to light and accomodation CV - (+)S1S2, no murmur, no JVD    Resp - CTA bilaterally, no wheezing or crackle  [place] at 2-3L  GI - (+)BS, soft, non-tender, non-distended Extremities- ROM normal.  No peripheral edema Skin - no rashes or wounds Neuro - alert, aware, oriented to person/place/time  Psych - normal affect and mood  All labs and imaging reviewed for 08/09/2024   Recent Labs    Units 08/09/24 0526  WBC thou/mcL 3.5*  HGB gm/dL 8.2*  HCT % 72.9*  PLT thou/mcL 121*   Recent Labs    Units 08/09/24 0526  NA mmol/L 142  K mmol/L 3.7  CL mmol/L 101  CO2 mmol/L 29  BUN mg/dL 77*  CREATININE mg/dL 7.60*  CALCIUM  mg/dL 8.7   Recent Labs    Units 08/09/24 0526 08/08/24 0506 08/07/24 0539  MAGNESIUM mg/dL 2.4 2.4 2.4   No results for input(s): TSH, T4, HGBA1C in the last 168 hours. Recent Labs    Units 08/08/24 0506  ALBUMIN gm/dL 3.1*   No results for input(s): LABPROT, INR, PTT in the last 168 hours. No results for input(s): CHOL, LDL, HDL, TRIG in the last 168 hours. Recent Labs    Units 08/09/24 0526 08/08/24 0506 08/07/24 0539 08/06/24 0328 08/05/24 0242 08/04/24 0200  GLUCOSE mg/dL 88 92 92 890* 882* 898*   No results for input(s): TROPONIN, CK in the last 168 hours.  Invalid input(s): CK-MB Recent Labs    Units 08/06/24 0328 08/04/24 0200  BNP pg/mL 4,226* 4,507*    . [Held by Provider] allopurinol   100 mg Oral q  AM  . aspirin  81 mg Oral Daily  . atorvastatin   20 mg Oral HS  . budesonide-formoterol  2 puff Inhalation Q12H RSP   And  . umeclidinium bromide   1 puff Inhalation Daily RSP  . buPROPion  hcl  300 mg Oral q AM  . calcium  carbonate  1,250 mg Oral Daily  . [START ON 08/10/2024] empagliflozin  10 mg Oral Daily  . finasteride  5 mg Oral HS  . heparin  5,000 Units Subcutaneous Q8H 02-26-21  . metoprolol  succinate  12.5 mg Oral Daily  . NaCl  10 mL Intracatheter Q12H SCH  . pantoprazole  sodium  40 mg Oral BID  . sertraline  100 mg Oral Daily  . tamsulosin   0.4 mg Oral BID  . [START ON 08/10/2024] torsemide   20 mg Oral  Daily   . NaCl    . NaCl    . NaCl    . NaCl    . NaCl    . NaCl     acetaminophen  **OR** acetaminophen , albuterol  sulfate, guaiFENesin, hydrALAZINE HCl, HYDROmorphone, HYDROmorphone, melatonin, NaCl, NaCl, NaCl, NaCl, NaCl, NaCl, NaCl **AND** NaCl, nitroGLYCERIN , ondansetron  **OR** ondansetron , polyethylene glycol, sodium chloride  Pertinent Diagnostics: NM PYP Amyloid Spect w/CT Result Date: 08/09/2024 INDICATION: Eval Amyloid TECHNIQUE: NM PYP AMYLOID SPECT W/CT.  21.9 mCi technetium pyrophosphate. CT images obtained for attenuation correction and localization purposes. SPECT images were generated. Exam date/time: 08/09/2024 9:16 AM.  Comparison none. FINDINGS: Additionally there is no activity within the myocardium. Normal bony activity. This is grade 0. Ratio is 1.2. Cardiomegaly. Emphysema.   IMPRESSION: 1. No evidence of cardiac amyloid Electronically Signed by: Glendia Guillaume, MD on 08/09/2024 3:19 PM    Tele/EKG:  Assessment/Plan   Assessment: Principal Problem:   Acute exacerbation of chronic heart failure (*)   Plan:  Volume overload; acute on chronic combined systolic and diastolic heart failure, nonischemic cardiomyopathy:  proBNP on admission at [facility] was 7612 which improved to 4507 after diuresis.   Troponin 43, 39 with a delta 3-hour of -4.   Cardiology was consulted at [facility].  Diuresis was limited due to AKI, hypotension.   Echocardiogram 11/10 report with LVEF 55 to 60%, strain imaging suggest cardiac amyloidosis, wall motion abnormalities (hypokinesis of inferior basal), mild AR (cannot rule out bicuspid valve), moderate MR, mild to moderate TR. - Hold diuresis due to AKI - Hold lisinopril - Continue metoprolol  - PYP negative- rules out Amyloidosis.     Acute kidney injury on CKD stage IIIb;  right renal mass with recent cryoablation;  right recent retroperitoneal hematoma:  Per 11/22 discharge summary patient had CT abdomen pelvis 11/9 with report of  complex collection within the right retropharyngeal space suggestive of possible recent hematoma with no active hemorrhage present.  Also noted heterogeneous ill-defined mass involving the inferior right kidney concerning for neoplasm recommend multiphase CT.  Per discharge summary there was concern patient may have hemorrhagic renal cyst rather than malignancy and recommended multiphase renal CT or MRI with contrast for definite evaluation.  Due to AKI these were not pursued at that time.  Nephrology at [facility] consulted. - Nephrology recommended holding diuretics, antihypertensives on hold, gentle IV hydration and no acute indication for dialysis - Regarding complicated right kidney cyst nephrology noted could be evaluated the outpatient setting and would want to see improvement in AKI before iodinated contrast exposure.   Anemia of chronic disease;  thrombocytopenia:  Followed by hematology in the outpatient setting.  Per 11/22 discharge summary patient was  noted to have macrocytic anemia, thrombocytopenia and had a history of vitamin B12 deficiency, iron deficiency.  Gastroenterology [facility]) was consulted and did not recommend endoscopic evaluation but did recommend outpatient follow-up for consideration of small bowel evaluation as well as hematology evaluation.  Per discharge summary patient's [place] direct test was negative, peripheral smear returned with moderate macrocytic, normochromic anemia, mild thrombocytopenia and negative for increased schistocytes, significant dysplasia or blast.  Patient received 4 units of blood due to persistent anemia.  Hemoglobin upon transfer was 7.7.  Hemoglobin at [facility] was 8.4. - Continue to monitor CBC - Continue heparin subcutaneous  - Plan to have patient follow-up with hematology outpatient as previously scheduled.   Depression:  Continue sertraline, Wellbutrin    COPD; chronic respiratory failure:  Less concern for acute exacerbation at this time.   Patient was  maintained on his home 3 L at outside hospital. - currently on stable home rate.    Goals of care:  At [facility] palliative care was consulted.  Patient currently full code.  Per 08/04/2024 palliative care note plan at that time was to have patient follow-up outpatient palliative medicine for symptom management and ongoing goals of care discussion - Of note at [facility] patient had improvement of his pain with p.o. Dilaudid and palliative care team recommended continuing this on discharge.   Prophylaxis   DVT Pharmacological Prophylaxis (From admission, onward)     Start     Ordered   08/07/24 0900  aspirin chewable tablet 81 mg  Daily        08/06/24 1600   08/07/24 0800  heparin injection 5,000 Units  Every 8 hours @ 0600 & 1400 & 2200        08/07/24 0719           DVT Mechanical Prophylaxis (From admission, onward)     Start     Ordered   08/06/24 1600  Place sequential compression device  Until discontinued       Comments: Place sequential compression device.  Start Time: 08/06/24 1600  Order ID: [id number]  Status: Sent   08/06/24 1600             GI:  Not indicated   Fluids, electrolytes, nutrition Diet: Cardiac diet 1800 ML FLUID (500 ML PER TRAY) Dietary nutrition supplements Ensure Plus High Protein; Vanilla Percent Meals Eaten (%): 0 % .lp      General  Ethics: Full Code  Surrogate decision maker: wife   PCP: Norleen KANDICE Cheshire, MD [phone]   Disposition  Anticipat home with wife in 1-3 days pending renal function an ddiuretic restart   Discussed plan of care with CM, RN, Attending Physician I have discussed the diagnoses and care plan with the patient.   Debby HERO Mangiacapre, NP 08/09/2024 6:23 PM

## 2024-08-09 NOTE — Progress Notes (Signed)
 [facility]   Nephrology Consultation - Follow Up Note  Attending Physician: Norleen JENEANE Homestead, MD Consulting Physician: Mabel Shuck, MD Date of service: 08/09/2024 Reason For Consult:  Acute kidney injury    Assessment / Recommendations and Plans    # Acute kidney injury: Sonogram without obstruction.  No proteinuria.  I think his AKI is most likely due to overdiuresis.  He was noted to be 18 pounds down his [place] hospitalization with alkalosis and hypotension.  He has absolutely no edema and is on his baseline home oxygen on presentation to [place].  I have stressed to him the interplay between his numerous chronic advanced organ failure comorbidities (CHF, COPD, cirrhosis, CKD) and that it is difficult to have an optimal balance between these.  - Keep all antihypertensives and diuretics on hold at this point.  He is making urine and his creatinine is down slightly to 2.3 today from 3.4 on presentation.  I think we can hold off on any more IV fluids today and allow him to eat and drink normally then hopefully resume oral diuretics tomorrow to avoid the cardiorenal pendulum swinging too far in the opposite direction. -I have ordered his home torsemide  and Jardiance to resume tomorrow morning.  Can gradually reintroduce some of his GDMT as tolerated.  However, based on his preadmission blood pressures with systolics in the 80s to 90s, I think we should hold off on reintroducing his lisinopril and spironolactone for the foreseeable future until his diuretics are optimized  # Complicated right kidney cyst: This seems to be a nonurgent issue at present and can be dealt with in the outpatient setting.  Certainly would want to see improvement in his AKI before delivering an iodinated contrast load.   # Acute on chronic diastolic CHF:  # COPD # chronic hypoxic respiratory failure: Baseline 3 L nasal cannula oxygen - EF 55 to 60% with severely dilated bilateral  atria and severely elevated RVSP.   # Anemia of chronic disease: Follows with [facility] hematology for outpatient ESA.  May need to consider putting this on hold until the lesion on his kidney is addressed   # Goals of care: Presently full code.  Of note palliative care was following at [place] and that would be reasonable.  He has numerous chronic debilitating illnesses.   History    Patrick Stephens is a 78 y.o. male with PMH as below (including stage IIIb CKD followed in clinic by [facility] nephrology, HFpEF, CAD, A-fib status post watchman, liver cirrhosis, COPD with chronic hypoxic respiratory failure on 3 L nasal cannula baseline, hypertension) who is seen as a new consult by nephrology on 08/06/2024 at [facility] for acute kidney injury.  On my review of the record he appears to have fairly longstanding CKD following an AKI episode in 2019 with a peak creatinine of 3.6.  Following that his baseline creatinine was about 1.4-1.6 through early 2025, 1.7-2.0 as of late summer 2025 and remained at that level at the time of hospitalization at [facility] in early October 2025. 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 During the procedure he developed a small right posterior pararenal hematoma without continued expansion post procedure and was admitted.  He had hypotension and the creatinine trended up trending up to 4.4 by late October, then back down to 1.6 in early November with supportive care and holding diuretics temporarily (though volume  seem to be a struggle) when rehospitalized to [facility] 07/24/2024 for shortness of breath and weight gain. Then creatinine climbed over the next 2 weeks back up to 3.5 at the time of transfer to [facility] and nephrology consultation on 08/06/2024.  He was hypotensive throughout a large portion of this time with systolics from the 80s to low 100s.  Continued to receive  spironolactone, lisinopril, SGLT2 inhibitor and aggressive diuresis with 3 to 4 L of urine output for several days (18 pounds down) until AKI worsened as well as metabolic alkalosis.  Interval history / subjective: No acute events overnight.  Breathing comfortably on room air  Inpatient Medications: Scheduled Meds: . [Held by Provider] allopurinol   100 mg Oral q AM  . aspirin  81 mg Oral Daily  . atorvastatin   20 mg Oral HS  . budesonide-formoterol  2 puff Inhalation Q12H RSP   And  . umeclidinium bromide   1 puff Inhalation Daily RSP  . buPROPion  hcl  300 mg Oral q AM  . calcium  carbonate  1,250 mg Oral Daily  . finasteride  5 mg Oral HS  . heparin  5,000 Units Subcutaneous Q8H 02-26-21  . metoprolol  succinate  12.5 mg Oral Daily  . NaCl  10 mL Intracatheter Q12H SCH  . pantoprazole  sodium  40 mg Oral BID  . sertraline  100 mg Oral Daily  . tamsulosin   0.4 mg Oral BID   Continuous Infusions: . NaCl    . NaCl    . NaCl    . NaCl    . NaCl    . NaCl     PRN Meds:.acetaminophen  **OR** acetaminophen , albuterol  sulfate, guaiFENesin, hydrALAZINE HCl, HYDROmorphone, HYDROmorphone, melatonin, NaCl, NaCl, NaCl, NaCl, NaCl, NaCl, NaCl **AND** NaCl, nitroGLYCERIN , ondansetron  **OR** ondansetron , polyethylene glycol, sodium chloride   Physical Examination  Temp:  [98.1 F (36.7 C)-98.9 F (37.2 C)] 98.9 F (37.2 C) Heart Rate:  [67-87] 87 Resp:  [15-18] 15 BP: (100-146)/(55-69) 141/64 SpO2:  [90 %-99 %] 97 % Pain Score: 0-No pain O2 Device: None (Room air) No data recorded I/O last 3 completed shifts: In: 1110 [P.O.:960; I.V.:150] Out: 1800 [Urine:1800]      Constitutional: Alert, no apparent distress, vitals reviewed by me chronically ill-appearing  Cardiovascular: Regular rhythm, normal rate, no murmurs, no rubs, no edema  Pulmonary: Breath sounds very distant,  normal respiratory effort  Gastrointestinal:  Abdomen soft, non-tender, non-distended.  Skin: no rash, no  induration on extremities  Neurological: Oriented to PP&T , moves all extremities without overt focal motor deficit Dialysis Access:    Results    Labs:  Recent Results (from the past 24 hours)  CBC And Differential   Collection Time: 08/09/24  5:26 AM  Result Value Ref Range   WBC 3.5 (L) 4.0 - 10.5 thou/mcL   RBC 2.72 (L) 4.63 - 6.08 million/mcL   HGB 8.2 (L) 13.7 - 17.5 gm/dL   HCT 72.9 (L) 59.8 - 48.9 %   MCV 99.3 (H) 79.0 - 92.2 fL   MCH 30.1 25.7 - 32.2 pg   MCHC 30.4 (L) 32.3 - 36.5 gm/dL   Plt Ct 878 (L) 849 - 400 thou/mcL   RDW SD 68.1 (H) 35.1 - 46.3 fL   MPV 11.4 9.4 - 12.4 fL   NRBC% 0.0 0.0 - 0.2 /100WBC   Absolute NRBC Count 0.00 0.00 - 0.01 thou/mcL   NEUTROPHIL % 63.2 %   LYMPHOCYTE % 15.4 %   MONOCYTE % 11.4 %   Eosinophil %  8.3 %   BASOPHIL % 1.1 %   IG% 0.6 %   ABSOLUTE NEUTROPHIL COUNT 2.21 1.50 - 7.50 thou/mcL   ABSOLUTE LYMPHOCYTE COUNT 0.54 (L) 1.00 - 4.50 thou/mcL   Absolute Monocyte Count 0.40 0.10 - 0.80 thou/mcL   Absolute Eosinophil Count 0.29 0.00 - 0.50 thou/mcL   Absolute Basophil Count 0.04 0.00 - 0.20 thou/mcL   Absolute Immature Granulocyte Count 0.02 0.00 - 0.03 thou/mcL   Magnesium   Collection Time: 08/09/24  5:26 AM  Result Value Ref Range   Mg 2.4 1.6 - 2.6 mg/dL  Basic Metabolic Panel   Collection Time: 08/09/24  5:26 AM  Result Value Ref Range   Na 142 136 - 146 mmol/L   Potassium 3.7 3.7 - 5.4 mmol/L   Cl 101 97 - 108 mmol/L   CO2 29 20 - 32 mmol/L   AGAP 12 7 - 16 mmol/L   Glucose 88 65 - 99 mg/dL   BUN 77 (H) 8 - 27 mg/dL   Creatinine 7.60 (H) 9.23 - 1.27 mg/dL   Ca 8.7 8.6 - 89.7 mg/dL   BUN/CREAT RATIO 67.7 (H) 11.0 - 26.0   eGFR 27 (L) >=60 mL/min/1.61m2    Imaging: NM PYP Amyloid Spect w/CT Result Date: 08/09/2024 INDICATION: Eval Amyloid TECHNIQUE: NM PYP AMYLOID SPECT W/CT.  21.9 mCi technetium pyrophosphate. CT images obtained for attenuation correction and localization purposes. SPECT images were generated.  Exam date/time: 08/09/2024 9:16 AM.  Comparison none. FINDINGS: Additionally there is no activity within the myocardium. Normal bony activity. This is grade 0. Ratio is 1.2. Cardiomegaly. Emphysema.   IMPRESSION: 1. No evidence of cardiac amyloid Electronically Signed by: Glendia Guillaume, MD on 08/09/2024 3:19 PM     I have personally reviewed the labs and imaging listed above.   This note was dictated with voice recognition software. Similar sounding words can inadvertently be transcribed and may not be corrected upon review.   Electronically Signed: Mabel Shuck, MD 08/09/2024 / 4:35 PM

## 2024-08-09 NOTE — Care Plan (Signed)
  Problem: Fall Prevention Goal: Absence of falls Outcome: Progressing   Problem: Deep Venous Thrombosis, Risk of Goal: Absence of deep venous thrombosis Outcome: Progressing   Problem: Bleeding, Risk of Goal: Absence of active bleeding Outcome: Progressing Goal: Absence of impaired coagulation signs and symptoms Outcome: Progressing

## 2024-08-10 NOTE — Progress Notes (Signed)
 [facility] Case Management     Patient:   Patrick Stephens MR Number:  [id number] Patient Date of Birth: [birth year] Age/Sex:  78 y.o./male     CM not following Pt.  CM spoke with Pt Spouse, Mrs. Patrick Stephens re: Pt will d/c today and Pt rights as a Medicare recipient.  Mrs. Patrick Stephens gave verbal consent for IMM.  IMM signed, placed in the basket for filing and documented.    ADDENDUM: Consult for HHPT entered.  Mrs. Patrick Stephens stated that they do not have a preference as to which agency provides the service.  CM messaged [facility] re: Would You Please send out for HHPT for Pt.  Pt does not have a preference and is d/c today.  Mrs. Patrick Stephens was informed that she would be contacted by the [employer] re: agency to provide HHPT.   Electronically signed: Marelyn Ada 08/10/2024 / 4:07 PM

## 2024-08-10 NOTE — Care Plan (Signed)
  Problem: Fall Prevention Goal: Absence of falls 08/10/2024 1625 by Maurilio Dustman, RN Outcome: Adequate for Discharge 08/10/2024 1158 by Maurilio Dustman, RN Outcome: Progressing   Problem: Deep Venous Thrombosis, Risk of Goal: Absence of deep venous thrombosis 08/10/2024 1625 by Maurilio Dustman, RN Outcome: Adequate for Discharge 08/10/2024 1158 by Maurilio Dustman, RN Outcome: Progressing   Problem: Bleeding, Risk of Goal: Absence of active bleeding 08/10/2024 1625 by Maurilio Dustman, RN Outcome: Adequate for Discharge 08/10/2024 1158 by Maurilio Dustman, RN Outcome: Progressing Goal: Absence of impaired coagulation signs and symptoms 08/10/2024 1625 by Maurilio Dustman, RN Outcome: Adequate for Discharge 08/10/2024 1158 by Maurilio Dustman, RN Outcome: Progressing   Problem: Mobility, Impaired Goal: Ambulation Description: Patient will ambulate 150 feet with RW with SBA and SpO2 >92% on </=3L by 09/07/2024. Lacinda Saint, DPT 08/08/2024 / 3:06 PM    Outcome: Adequate for Discharge   Problem: Balance, Impaired Goal: Balance Description: Patient will maintain dynamic standing balance with RW, no LOB, and SBA to engage with environment and perform ADLs by 09/07/2024. Lacinda Saint, DPT 08/08/2024 / 3:06 PM    Outcome: Adequate for Discharge   Problem: Endurance and Activity Tolerance, Impaired Goal: Endurance Description: Patient will demonstrate improved endurance with performing at least 30 minutes of physical activity with SpO2 >90% on </=3L by 09/07/2024. Lacinda Saint, DPT 08/08/2024 / 3:07 PM    Outcome: Adequate for Discharge

## 2024-08-10 NOTE — Progress Notes (Signed)
 [facility]  Focused Physical Therapy Treatment Note   Patient Name:  Patrick Stephens Date of Birth:  [birth year]  Today's Date: August 10, 2024  PT Diagnosis: Other abnormalities of gait and mobility;Other (comment) (decreased functional endurance)  Assessment    Prior level of function Current level of mobility Current functional limitations/ impairments Rehab potential  Modified Independent with ambulation, Modified Independent with functional transfers   1 person;Supervision (verbal cues/ setup/ safety) (CGA-SBA)   Decreased activity tolerance;Generalized weakness;Impaired balance;Fall risk;Gait instability Good   Patient was able to demonstrate progress and improvement with endurance and mobility. Utilized 3LPM to complete 263ft with few standing rest breaks for deep breathing and recovery. Desaturation noted  low 80s at 3LPM but recovered well in less than 1 minute. Pt dropped to 71-75% at 2LPM. Stability w/ ambulation using RW improved this session. Patient and caregiver education was provided regarding CHF prevention and management, diet and exercise. Patient will benefit with continued skilled PT servcies to address functional deficits and is recommended to have HHPT services following DC and progress to cardiopulmonary rehab as OP to optimize functional outcome.  Plan for Current Admission   Needs during current admission Treatment/Interventions PT Frequency Duration of Treatment  Continued skilled PT   Balance training;Gait training;Therapeutic exercise/strengthening;Endurance training;Patient/support person education 3x/wk  Duration of Treatment start date:: 08/08/24 Duration of Treatment end date:: 09/07/24    Recommendations for Discharge   Anticipated PT needs at discharge Anticipated caregiver needs at discharge DME Recommendations  Home Health PT (if qualifications met) (progress to OPPT for cardiopulmonary rehab)  Supervision for  safety/cues  Rolling walker (tall RW)    PTBarriers to Discharge Home  No barriers anticipated   Recommendations above align with AMPAC score of Basic Mobility 22.  *Actual disposition location dependent on change in patient's functional status, patient/caregiver preferences, medical needs, bed availability and insurance approvals.   Subjective  Pt was agreeable to the session   Objective  Spoke with nursing.  Nursing cleared patient to participate in therapy.    Patient left in chair with all lines/leads intact. Call bell and other needs in reach and chair alarm in place and engaged.  Family present for session? Yes   Activity Tolerance: Refer to flowsheet   Precautions  Other Precautions: fall risk, full code, PIV, O2 Precautions discussed with:: Patient;Spouse;Nurse    Pain    Pt denies any pain.    Skilled Intervention Details  Teacher, Early Years/pre  Multiple trials of STS from recliner, use of hands to push up but transitioned safely and independently to unsupported standing x 4 reps with SBA for observation.    Gait/Stair Training  Ambulation completed with 2LPM x 72ft but pt started desaturating to 71-75% very quickly. O2 increased to 3LPM, deep breaths and pursed lip breathing recommended with good recovery to 92-94. During ambulation pt was asked to do some standing rest breaks for recovery because pt was desaturating to 82-84% at 3LPM.   Patient reports slight SOB but not bad. Pt was instructed to use his level of SOB as reference for when to stop and rest and when to proceed.    Education/Family Training  Lengthy education and discussion of disease mgt: AKI and CHF Daily weights - same time everyday: >3lbs overnight or >5lbs in a week - notify MD Monitoring of symptoms - increased SOB, difficulty w/ laying flat, decreased urine output, dark colored urine, fatigue, confusion Fluid and diet - low sodium, follow MD orders on fluid  intake/restriction Medications -  follow as prescribed by provider Activity and energy conservation - balance activity w/ rest, pacing of activities, pursed-lip breathing, stop activity w/ chest pain or increased SOB, use of AD Notify MD with any symptoms noted  Patient and wife verbalized good understanding.   DC recs - start with HHPT or resume cardiopulmonary rehab as OP.   Taller RW is recommended because pt's rollator is too short.     Functional Tests AM-PAC Basic Mobility Turning from your back to your side while in a flat bed without using bedrails?: None Moving from lying on your back to sitting on the side of a flat bed without using bedrails?: None Moving to and from a bed to a chair (including a wheelchair)?: None Standing up from a chair using your arms (e.g., wheelchair, or bedside chair)? : None Walking in hospital room?: A little Climbing 3-5 steps with a railing?: A little AM-PAC Basic Mobility Raw Score (out of 24): 22 Routine Mobility Goal: Walk 25 Feet or More & All ADLs in Bathroom After Gathering Supplies in Room 7 [facility] Highest level of Mobility (JH-HLM) rating from treatment session: 7- Walk 25 ft or more     Goals  PT - Patient/Support Person Stated Goals: return to prior activities, resume cariopulmonary rehab at [facility].   Physical Therapy Care Plan (Active)     Problem: Balance, Impaired     Dates: Start:  08/08/24       Goal: Balance     Dates: Start:  08/08/24    Expected End:  09/07/24      Description: Patient will maintain dynamic standing balance with RW, no LOB, and SBA to engage with environment and perform ADLs by 09/07/2024. Lacinda Saint, DPT 08/08/2024 / 3:06 PM           Problem: Endurance and Activity Tolerance, Impaired     Dates: Start:  08/08/24       Goal: Endurance     Dates: Start:  08/08/24    Expected End:  09/07/24      Description: Patient will demonstrate improved endurance with performing at least 30 minutes of physical  activity with SpO2 >90% on </=3L by 09/07/2024. Lacinda Saint, DPT 08/08/2024 / 3:07 PM           Problem: Mobility, Impaired     Dates: Start:  08/08/24       Goal: Ambulation     Dates: Start:  08/08/24    Expected End:  09/07/24      Description: Patient will ambulate 150 feet with RW with SBA and SpO2 >92% on </=3L by 09/07/2024. Lacinda Saint, DPT 08/08/2024 / 3:06 PM           Key (I=independent, ModI=modified independent, S=supervision, CGA=contact guard assist, Min=minimal assist, Mod=moderate assist, Max=maximal assist, D= dependent)   Plan of Care was discussed with and agreed upon by Patient/Caregiver: Yes  Treatment Time   Today's Evaluation/Treatment: 1408 - 1446 Total Time: 38 min Treatment Day: 2   Charges  Total Time Code Treatment Minutes: 38  Therapeutic Charges $ Gait training: 1 unit $ Therapeutic Activity: 2 units  Khristine Javelosa 08/10/2024 3:04 PM

## 2024-08-10 NOTE — Progress Notes (Signed)
 [facility]   Nephrology Consultation - Follow Up Note  Attending Physician: Norleen JENEANE Homestead, MD Consulting Physician: Marsa JAYSON Silversmith, MD Date of service: 08/10/2024 Reason For Consult:  Acute kidney injury    Assessment / Recommendations and Plans    # Acute kidney injury: Sonogram without obstruction.  No proteinuria.  I think his AKI is most likely due to overdiuresis.  He was noted to be 18 pounds down his [place] hospitalization with alkalosis and hypotension.  He has absolutely no edema and is on his baseline home oxygen on presentation to [place].  I have stressed to him the interplay between his numerous chronic advanced organ failure comorbidities (CHF, COPD, cirrhosis, CKD) and that it is difficult to have an optimal balance between these.  - Resume torsemide  and Jardiance today.  Creatinine down to 2.2.  Has some mild lower extremity edema now but not grossly volume overloaded.  Continue to hold spironolactone and lisinopril until he can follow-up with his outpatient nephrologist.  He stable for discharge from renal standpoint.  Will need close follow-up with his nephrologist at [facility].  # Complicated right kidney cyst: This seems to be a nonurgent issue at present and can be dealt with in the outpatient setting.  Certainly would want to see improvement in his AKI before delivering an iodinated contrast load.   # Acute on chronic diastolic CHF:  # COPD # chronic hypoxic respiratory failure: Baseline 3 L nasal cannula oxygen - EF 55 to 60% with severely dilated bilateral atria and severely elevated RVSP.   # Anemia of chronic disease: Follows with [facility] hematology for outpatient ESA.  May need to consider putting this on hold until the lesion on his kidney is addressed   # Goals of care: Presently full code.  Of note palliative care was following at [place] and that would be reasonable.  He has numerous chronic debilitating  illnesses.   History    Patrick Stephens is a 78 y.o. male with PMH as below (including stage IIIb CKD followed in clinic by [facility] nephrology, HFpEF, CAD, A-fib status post watchman, liver cirrhosis, COPD with chronic hypoxic respiratory failure on 3 L nasal cannula baseline, hypertension) who is seen as a new consult by nephrology on 08/06/2024 at [facility] for acute kidney injury.  On my review of the record he appears to have fairly longstanding CKD following an AKI episode in 2019 with a peak creatinine of 3.6.  Following that his baseline creatinine was about 1.4-1.6 through early 2025, 1.7-2.0 as of late summer 2025 and remained at that level at the time of hospitalization at [facility] in early October 2025. 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 During the procedure he developed a small right posterior pararenal hematoma without continued expansion post procedure and was admitted.  He had hypotension and the creatinine trended up trending up to 4.4 by late October, then back down to 1.6 in early November with supportive care and holding diuretics temporarily (though volume seem to be a struggle) when rehospitalized to [facility] 07/24/2024 for shortness of breath and weight gain. Then creatinine climbed over the next 2 weeks back up to 3.5 at the time of transfer to [facility] and nephrology consultation on 08/06/2024.  He was hypotensive throughout a large portion of this time with systolics from the 80s to low 100s.  Continued to receive spironolactone, lisinopril, SGLT2  inhibitor and aggressive diuresis with 3 to 4 L of urine output for several days (18 pounds down) until AKI worsened as well as metabolic alkalosis.  Interval history / subjective:  Patient is awake and alert this morning.  Says he is breathing comfortably.  Hopes to go home soon but complains that he is weak and will need physical  therapy at home.  He is not interested in posthospital rehab except at home.  He is breathing comfortably today.  No nausea or vomiting.    Inpatient Medications: Scheduled Meds: . [Held by Provider] allopurinol   100 mg Oral q AM  . aspirin  81 mg Oral Daily  . atorvastatin   20 mg Oral HS  . budesonide-formoterol  2 puff Inhalation Q12H RSP   And  . umeclidinium bromide   1 puff Inhalation Daily RSP  . buPROPion  hcl  300 mg Oral q AM  . calcium  carbonate  1,250 mg Oral Daily  . empagliflozin  10 mg Oral Daily  . finasteride  5 mg Oral HS  . heparin  5,000 Units Subcutaneous Q8H 02-26-21  . metoprolol  succinate  12.5 mg Oral Daily  . NaCl  10 mL Intracatheter Q12H SCH  . pantoprazole  sodium  40 mg Oral BID  . sertraline  100 mg Oral Daily  . tamsulosin   0.4 mg Oral BID  . torsemide   20 mg Oral Daily   Continuous Infusions: . NaCl    . NaCl    . NaCl    . NaCl    . NaCl    . NaCl     PRN Meds:.acetaminophen  **OR** acetaminophen , albuterol  sulfate, guaiFENesin, hydrALAZINE HCl, HYDROmorphone, HYDROmorphone, melatonin, NaCl, NaCl, NaCl, NaCl, NaCl, NaCl, NaCl **AND** NaCl, nitroGLYCERIN , ondansetron  **OR** ondansetron , polyethylene glycol, sodium chloride   Physical Examination  Temp:  [98.6 F (37 C)-99.6 F (37.6 C)] 98.6 F (37 C) Heart Rate:  [70-91] 70 Resp:  [15-18] 18 BP: (113-141)/(47-64) 127/61 SpO2:  [95 %-100 %] 98 % Pain Score: 0-No pain O2 Device: Nasal cannula O2 Flow Rate (L/min): 1 L/min No data recorded I/O last 3 completed shifts: In: 540 [P.O.:540] Out: 1550 [Urine:1550]      Constitutional: Alert, no apparent distress, reviewed vital signs.  He appears chronically ill. Cardiovascular: Regular rhythm, normal rate, no murmurs, no rubs, trace bilateral ankle edema Pulmonary: Breath sounds very distant,  normal respiratory effort  Gastrointestinal:  Abdomen soft, non-tender, non-distended.  Skin: no rash, no induration on extremities   Neurological: Alert and oriented.  No tremor.    Results    Labs:  Recent Results (from the past 24 hours)  Basic Metabolic Panel   Collection Time: 08/10/24  5:02 AM  Result Value Ref Range   Na 141 136 - 146 mmol/L   Potassium 4.1 3.7 - 5.4 mmol/L   Cl 102 97 - 108 mmol/L   CO2 31 20 - 32 mmol/L   AGAP 8 7 - 16 mmol/L   Glucose 91 65 - 99 mg/dL   BUN 78 (H) 8 - 27 mg/dL   Creatinine 7.79 (H) 9.23 - 1.27 mg/dL   Ca 9.0 8.6 - 89.7 mg/dL   BUN/CREAT RATIO 64.4 (H) 11.0 - 26.0   eGFR 30 (L) >=60 mL/min/1.70m2    Imaging: NM PYP Amyloid Spect w/CT Result Date: 08/09/2024 INDICATION: Eval Amyloid TECHNIQUE: NM PYP AMYLOID SPECT W/CT.  21.9 mCi technetium pyrophosphate. CT images obtained for attenuation correction and localization purposes. SPECT images were generated. Exam date/time: 08/09/2024 9:16 AM.  Comparison  none. FINDINGS: Additionally there is no activity within the myocardium. Normal bony activity. This is grade 0. Ratio is 1.2. Cardiomegaly. Emphysema.   IMPRESSION: 1. No evidence of cardiac amyloid Electronically Signed by: Glendia Guillaume, MD on 08/09/2024 3:19 PM     I have personally reviewed the labs and imaging listed above.   This note was dictated with voice recognition software. Similar sounding words can inadvertently be transcribed and may not be corrected upon review.   Electronically Signed: Marsa JAYSON Silversmith, MD 08/10/2024 / 10:29 AM
